# Patient Record
Sex: Female | Born: 1965 | Race: White | Hispanic: No | Marital: Married | Smoking: Never smoker
Health system: Southern US, Community
[De-identification: ages and names within clinical notes are randomized; demographics above are authoritative.]

## PROBLEM LIST (undated history)

## (undated) DIAGNOSIS — E079 Disorder of thyroid, unspecified: Secondary | ICD-10-CM

## (undated) HISTORY — PX: OTHER SURGICAL HISTORY: SHX169

## (undated) HISTORY — DX: Disorder of thyroid, unspecified: E07.9

## (undated) HISTORY — PX: WISDOM TOOTH EXTRACTION: SHX21

---

## 1998-02-18 ENCOUNTER — Other Ambulatory Visit: Admission: RE | Admit: 1998-02-18 | Discharge: 1998-02-18 | Payer: Self-pay | Admitting: *Deleted

## 1999-03-24 ENCOUNTER — Other Ambulatory Visit: Admission: RE | Admit: 1999-03-24 | Discharge: 1999-03-24 | Payer: Self-pay | Admitting: *Deleted

## 2006-08-20 ENCOUNTER — Other Ambulatory Visit: Admission: RE | Admit: 2006-08-20 | Discharge: 2006-08-20 | Payer: Self-pay | Admitting: Family Medicine

## 2006-11-03 ENCOUNTER — Encounter: Admission: RE | Admit: 2006-11-03 | Discharge: 2006-11-03 | Payer: Self-pay | Admitting: Family Medicine

## 2006-11-15 ENCOUNTER — Encounter: Admission: RE | Admit: 2006-11-15 | Discharge: 2006-11-15 | Payer: Self-pay | Admitting: Family Medicine

## 2007-05-10 ENCOUNTER — Encounter: Admission: RE | Admit: 2007-05-10 | Discharge: 2007-05-10 | Payer: Self-pay | Admitting: Family Medicine

## 2007-12-07 ENCOUNTER — Encounter: Admission: RE | Admit: 2007-12-07 | Discharge: 2007-12-07 | Payer: Self-pay | Admitting: Family Medicine

## 2008-04-09 ENCOUNTER — Other Ambulatory Visit: Admission: RE | Admit: 2008-04-09 | Discharge: 2008-04-09 | Payer: Self-pay | Admitting: Family Medicine

## 2008-10-10 ENCOUNTER — Other Ambulatory Visit: Admission: RE | Admit: 2008-10-10 | Discharge: 2008-10-10 | Payer: Self-pay | Admitting: Family Medicine

## 2008-12-07 ENCOUNTER — Encounter: Admission: RE | Admit: 2008-12-07 | Discharge: 2008-12-07 | Payer: Self-pay | Admitting: Family Medicine

## 2010-02-17 ENCOUNTER — Encounter: Admission: RE | Admit: 2010-02-17 | Discharge: 2010-02-17 | Payer: Self-pay | Admitting: Family Medicine

## 2010-11-09 ENCOUNTER — Encounter: Payer: Self-pay | Admitting: Family Medicine

## 2011-03-06 ENCOUNTER — Other Ambulatory Visit: Payer: Self-pay | Admitting: Family Medicine

## 2011-03-06 DIAGNOSIS — Z1231 Encounter for screening mammogram for malignant neoplasm of breast: Secondary | ICD-10-CM

## 2011-03-20 ENCOUNTER — Ambulatory Visit
Admission: RE | Admit: 2011-03-20 | Discharge: 2011-03-20 | Disposition: A | Discharge status: Home / Self Care | Payer: BC Managed Care – PPO | Source: Ambulatory Visit | Attending: Family Medicine | Admitting: Family Medicine

## 2011-03-20 DIAGNOSIS — Z1231 Encounter for screening mammogram for malignant neoplasm of breast: Secondary | ICD-10-CM

## 2012-02-22 ENCOUNTER — Other Ambulatory Visit: Payer: Self-pay | Admitting: Family Medicine

## 2012-02-22 DIAGNOSIS — Z1231 Encounter for screening mammogram for malignant neoplasm of breast: Secondary | ICD-10-CM

## 2012-03-21 ENCOUNTER — Ambulatory Visit
Admission: RE | Admit: 2012-03-21 | Discharge: 2012-03-21 | Disposition: A | Discharge status: Home / Self Care | Payer: BC Managed Care – PPO | Source: Ambulatory Visit | Attending: Family Medicine | Admitting: Family Medicine

## 2012-03-21 DIAGNOSIS — Z1231 Encounter for screening mammogram for malignant neoplasm of breast: Secondary | ICD-10-CM

## 2013-03-09 ENCOUNTER — Other Ambulatory Visit: Payer: Self-pay

## 2013-03-09 DIAGNOSIS — Z1231 Encounter for screening mammogram for malignant neoplasm of breast: Secondary | ICD-10-CM

## 2013-03-23 ENCOUNTER — Ambulatory Visit: Payer: BC Managed Care – PPO

## 2013-04-25 ENCOUNTER — Ambulatory Visit
Admission: RE | Admit: 2013-04-25 | Discharge: 2013-04-25 | Disposition: A | Discharge status: Home / Self Care | Payer: BC Managed Care – PPO | Source: Ambulatory Visit

## 2013-04-25 DIAGNOSIS — Z1231 Encounter for screening mammogram for malignant neoplasm of breast: Secondary | ICD-10-CM

## 2014-04-18 LAB — HM PAP SMEAR: HM PAP: NORMAL

## 2014-05-08 ENCOUNTER — Other Ambulatory Visit: Payer: Self-pay

## 2014-05-08 DIAGNOSIS — Z1231 Encounter for screening mammogram for malignant neoplasm of breast: Secondary | ICD-10-CM

## 2014-06-14 ENCOUNTER — Ambulatory Visit
Admission: RE | Admit: 2014-06-14 | Discharge: 2014-06-14 | Disposition: A | Discharge status: Home / Self Care | Payer: BC Managed Care – PPO | Source: Ambulatory Visit

## 2014-06-14 DIAGNOSIS — Z1231 Encounter for screening mammogram for malignant neoplasm of breast: Secondary | ICD-10-CM

## 2015-04-05 ENCOUNTER — Telehealth: Payer: Self-pay | Admitting: Family Medicine

## 2015-04-05 NOTE — Telephone Encounter (Signed)
Scheduled new pt appt based on tel note in pt husband acct.  From  Sheliah Hatch, MD   To  Madolyn Frieze   Sent and Delivered  03/29/2015 12:45 PM      Last Read in MyChart  03/29/2015 2:40 PM by Madolyn Frieze     We'd be happy to have her! I have no idea when my next available appt is- but you can certainly call the front desk and get her scheduled.   Have a wonderful weekend!  KT, MD      Previous Messages     ----- Message -----   From: Duplechain,RICHARD   Sent: 03/29/2015 7:18 AM EDT    To: Neena Rhymes, MD  Subject: Non-Urgent Medical Question   I wanted to check and see if Dr. Beverely Low is accepting new patients. My wife is unhappy with the level of preventative care from her current practice and is particularly concerned after the death of her father. Dr. Beverely Low and staff have always provided the utmost care and concern to me and I would like to extend that care to my wife.   If possible, when would be the earliest physical appointment available?   Thanks so much.  Richard

## 2015-05-09 ENCOUNTER — Other Ambulatory Visit: Payer: Self-pay

## 2015-05-09 DIAGNOSIS — Z1231 Encounter for screening mammogram for malignant neoplasm of breast: Secondary | ICD-10-CM

## 2015-06-19 LAB — HM MAMMOGRAPHY

## 2015-06-26 ENCOUNTER — Ambulatory Visit
Admission: RE | Admit: 2015-06-26 | Discharge: 2015-06-26 | Disposition: A | Discharge status: Home / Self Care | Payer: BC Managed Care – PPO | Source: Ambulatory Visit

## 2015-06-26 DIAGNOSIS — Z1231 Encounter for screening mammogram for malignant neoplasm of breast: Secondary | ICD-10-CM

## 2015-08-23 ENCOUNTER — Telehealth: Payer: Self-pay

## 2015-08-26 ENCOUNTER — Ambulatory Visit (INDEPENDENT_AMBULATORY_CARE_PROVIDER_SITE_OTHER): Payer: BC Managed Care – PPO | Admitting: Family Medicine

## 2015-08-26 ENCOUNTER — Encounter (INDEPENDENT_AMBULATORY_CARE_PROVIDER_SITE_OTHER): Payer: Self-pay

## 2015-08-26 ENCOUNTER — Encounter: Payer: Self-pay | Admitting: Family Medicine

## 2015-08-26 VITALS — BP 118/82 | HR 68 | Temp 98.2°F | Resp 16 | Ht 67.0 in | Wt 185.5 lb

## 2015-08-26 DIAGNOSIS — G4700 Insomnia, unspecified: Secondary | ICD-10-CM | POA: Diagnosis not present

## 2015-08-26 DIAGNOSIS — E669 Obesity, unspecified: Secondary | ICD-10-CM | POA: Insufficient documentation

## 2015-08-26 DIAGNOSIS — E663 Overweight: Secondary | ICD-10-CM | POA: Diagnosis not present

## 2015-08-26 DIAGNOSIS — Z8379 Family history of other diseases of the digestive system: Secondary | ICD-10-CM | POA: Diagnosis not present

## 2015-08-26 DIAGNOSIS — N951 Menopausal and female climacteric states: Secondary | ICD-10-CM | POA: Insufficient documentation

## 2015-08-26 MED ORDER — VENLAFAXINE HCL ER 37.5 MG PO CP24: ORAL_CAPSULE | ORAL | Status: DC

## 2015-08-26 MED ORDER — TRAZODONE HCL 50 MG PO TABS: 25.0000 mg | ORAL_TABLET | Freq: Every evening | ORAL | Status: DC | PRN

## 2015-08-26 NOTE — Patient Instructions (Signed)
Follow up in 4-6 weeks to recheck hot flashes, mood, weight loss progress We'll notify you of your lab results and make any changes if needed Continue to work on a low carb diet and get regular exercise Start the Effexor in the AM to improve your menopausal symptoms Start the Trazodone as needed for sleep.  Start w/ 1/2 tab nightly Drink LOTS of water! Call with any questions or concerns Welcome!  We're glad to have you!!!

## 2015-08-26 NOTE — Progress Notes (Signed)
   Subjective:    Patient ID: Dana Zavala, female    DOB: 10-27-1965, 49 y.o.   MRN: [id number]  HPI New to establish.  Previous MD- Bj[facility], [facility].  Health Maintenance- UTD on pap (done July 2015), mammo [facility]  Family hx of liver disease- pt reports father had life long hx of elevated liver enzymes.  Got sick very quickly earlier this year and was determined to have liver cancer.  Pt is very concerned as to whether this is genetic  Menopausal- pt has not had a period in over a year.  + weight gain.  Pt has gained 15 lbs since last August w/o dietary or activity changes.  + hot flashes, insomnia.  Some relief w/ Black Cohosh.   Review of Systems For ROS see HPI     Objective:   Physical Exam  Constitutional: She is oriented to person, place, and time. She appears well-developed and well-nourished. No distress.  overweight  HENT:  Head: Normocephalic and atraumatic.  Eyes: Conjunctivae and EOM are normal. Pupils are equal, round, and reactive to light.  Neck: Normal range of motion. Neck supple. No thyromegaly present.  Cardiovascular: Normal rate, regular rhythm, normal heart sounds and intact distal pulses.   No murmur heard. Pulmonary/Chest: Effort normal and breath sounds normal. No respiratory distress.  Abdominal: Soft. She exhibits no distension. There is no tenderness.  Musculoskeletal: She exhibits no edema.  Lymphadenopathy:    She has no cervical adenopathy.  Neurological: She is alert and oriented to person, place, and time.  Skin: Skin is warm and dry.  Psychiatric: She has a normal mood and affect. Her behavior is normal.  Vitals reviewed.         Assessment & Plan:

## 2015-08-26 NOTE — Progress Notes (Signed)
Pre visit review using our clinic review tool, if applicable. No additional management support is needed unless otherwise documented below in the visit note. 

## 2015-08-27 ENCOUNTER — Encounter: Payer: Self-pay | Admitting: General Practice

## 2015-08-27 LAB — CBC WITH DIFFERENTIAL/PLATELET
BASOS ABS: 0 10*3/uL (ref 0.0–0.1)
BASOS PCT: 0.6 % (ref 0.0–3.0)
EOS ABS: 0.1 10*3/uL (ref 0.0–0.7)
Eosinophils Relative: 1.1 % (ref 0.0–5.0)
HEMATOCRIT: 40.9 % (ref 36.0–46.0)
HEMOGLOBIN: 13.8 g/dL (ref 12.0–15.0)
LYMPHS PCT: 26.9 % (ref 12.0–46.0)
Lymphs Abs: 2 10*3/uL (ref 0.7–4.0)
MCHC: 33.8 g/dL (ref 30.0–36.0)
MCV: 89.6 fl (ref 78.0–100.0)
MONO ABS: 0.5 10*3/uL (ref 0.1–1.0)
Monocytes Relative: 6.5 % (ref 3.0–12.0)
Neutro Abs: 4.9 10*3/uL (ref 1.4–7.7)
Neutrophils Relative %: 64.9 % (ref 43.0–77.0)
Platelets: 277 10*3/uL (ref 150.0–400.0)
RBC: 4.57 Mil/uL (ref 3.87–5.11)
RDW: 12.4 % (ref 11.5–15.5)
WBC: 7.6 10*3/uL (ref 4.0–10.5)

## 2015-08-27 LAB — HEPATIC FUNCTION PANEL
ALBUMIN: 4.5 g/dL (ref 3.5–5.2)
ALT: 23 U/L (ref 0–35)
AST: 24 U/L (ref 0–37)
Alkaline Phosphatase: 61 U/L (ref 39–117)
BILIRUBIN TOTAL: 1 mg/dL (ref 0.2–1.2)
Bilirubin, Direct: 0.2 mg/dL (ref 0.0–0.3)
Total Protein: 7.3 g/dL (ref 6.0–8.3)

## 2015-08-27 LAB — BASIC METABOLIC PANEL
BUN: 17 mg/dL (ref 6–23)
CALCIUM: 10.3 mg/dL (ref 8.4–10.5)
CHLORIDE: 104 meq/L (ref 96–112)
CO2: 29 meq/L (ref 19–32)
CREATININE: 0.86 mg/dL (ref 0.40–1.20)
GFR: 74.42 mL/min (ref >60.00)
Glucose, Bld: 77 mg/dL (ref 70–99)
Potassium: 3.7 mEq/L (ref 3.5–5.1)
Sodium: 142 mEq/L (ref 135–145)

## 2015-08-27 LAB — LIPID PANEL
CHOL/HDL RATIO: 4
Cholesterol: 185 mg/dL (ref 0–200)
HDL: 49.8 mg/dL (ref >39.00)
LDL Cholesterol: 102 mg/dL — ABNORMAL HIGH (ref 0–99)
NONHDL: 135.68
TRIGLYCERIDES: 166 mg/dL — AB (ref 0.0–149.0)
VLDL: 33.2 mg/dL (ref 0.0–40.0)

## 2015-08-27 LAB — TSH: TSH: 2.55 u[IU]/mL (ref 0.35–4.50)

## 2015-08-27 NOTE — Telephone Encounter (Signed)
Pre-Visit call compleded

## 2015-08-28 ENCOUNTER — Telehealth: Payer: Self-pay | Admitting: *Deleted

## 2015-08-28 NOTE — Telephone Encounter (Signed)
Forwarded to Dr. Gayla Dossabori/Martavion Couper. JG//CMA

## 2015-09-18 NOTE — Assessment & Plan Note (Signed)
New.  Pt is struggling w/ weight gain, insomnia, hot flashes, irritability.  Based on her constellation of sxs, will start Effexor daily and monitor for improvement.  Pt expressed understanding and is in agreement w/ plan.

## 2015-09-18 NOTE — Assessment & Plan Note (Signed)
New to provider, ongoing for pt.  Check labs to risk stratify.  Stressed need for healthy diet and regular exercise.  Will follow. 

## 2015-09-18 NOTE — Assessment & Plan Note (Signed)
New.  Maybe a component of her menopausal syndrome but in order to provider relief, will start Trazodone.  Pt expressed understanding and is in agreement w/ plan.

## 2015-09-18 NOTE — Assessment & Plan Note (Signed)
New to provider.  Check labs as baseline.  If elevated, will need US.  Stressed need for healthy diet and weight management to prevent fatty liver.  Will follow.

## 2015-10-02 ENCOUNTER — Other Ambulatory Visit: Payer: Self-pay | Admitting: General Practice

## 2015-10-02 MED ORDER — VENLAFAXINE HCL ER 37.5 MG PO CP24: ORAL_CAPSULE | ORAL | Status: DC

## 2015-10-07 ENCOUNTER — Encounter: Payer: Self-pay | Admitting: Family Medicine

## 2015-10-07 ENCOUNTER — Ambulatory Visit (INDEPENDENT_AMBULATORY_CARE_PROVIDER_SITE_OTHER): Payer: BC Managed Care – PPO | Admitting: Family Medicine

## 2015-10-07 VITALS — BP 110/90 | HR 77 | Temp 98.1°F | Resp 16 | Ht 67.0 in | Wt 183.4 lb

## 2015-10-07 DIAGNOSIS — E663 Overweight: Secondary | ICD-10-CM

## 2015-10-07 DIAGNOSIS — N951 Menopausal and female climacteric states: Secondary | ICD-10-CM

## 2015-10-07 MED ORDER — VENLAFAXINE HCL ER 75 MG PO CP24: 75.0000 mg | ORAL_CAPSULE | Freq: Every day | ORAL | Status: DC

## 2015-10-07 NOTE — Progress Notes (Signed)
   Subjective:    Patient ID: Dana Zavala, female    DOB: 10-May-1966, 49 y.o.   MRN: [id number]  HPI Menopausal sxs- pt reports hot flashes have dramatically improved since starting Effexor.  Will still have some breakthrough sxs but not nearly as severe as previous.  Sleep has improved b/c of this.  Pt feels emotional sxs are also improving.  Pt has increased hope compared to last visit.    Obesity- pt has dramatically cut back on carbs.  Has lost 3 lbs over the holiday season.   Review of Systems For ROS see HPI     Objective:   Physical Exam  Constitutional: She is oriented to person, place, and time. She appears well-developed and well-nourished. No distress.  HENT:  Head: Normocephalic and atraumatic.  Eyes: Conjunctivae and EOM are normal. Pupils are equal, round, and reactive to light.  Neurological: She is alert and oriented to person, place, and time.  Skin: Skin is warm and dry.  Psychiatric: She has a normal mood and affect. Her behavior is normal. Thought content normal.  Vitals reviewed.         Assessment & Plan:

## 2015-10-07 NOTE — Progress Notes (Signed)
Pre visit review using our clinic review tool, if applicable. No additional management support is needed unless otherwise documented below in the visit note. 

## 2015-10-07 NOTE — Patient Instructions (Signed)
Schedule your complete physical in 6 months We'll notify you of your lab results and make any changes if needed Continue the Effexor daily at 75mg  daily Keep up the good work on healthy diet and regular exercise- you are doing it!! Call with any questions or concerns If you want to join us at the new [place] office, any scheduled appointments will automatically transfer and we will see you at [address], [place], [place] [id number] (OPENING 10/22/15) Happy Holidays!!!

## 2015-10-07 NOTE — Assessment & Plan Note (Signed)
Improved since starting Effexor.  Hot flashes have dramatically decreased and mood has improved.  Continue Effexor at 75mg  daily.  New script provided.  Will follow.

## 2015-10-07 NOTE — Assessment & Plan Note (Signed)
Pt is working on changing her diet.  Is now eating in moderation and making trade-offs if there's something she wants.  Discussed need for regular exercise.  Will continue to follow.

## 2015-10-08 ENCOUNTER — Other Ambulatory Visit: Payer: Self-pay | Admitting: General Practice

## 2015-10-08 NOTE — Telephone Encounter (Signed)
Pt was seen yesterday.  Trazodone last filled 08/26/15 #30 with 3, pt is requesting a #90 day supply. Ok to fill?

## 2015-10-09 MED ORDER — TRAZODONE HCL 50 MG PO TABS: 25.0000 mg | ORAL_TABLET | Freq: Every evening | ORAL | Status: DC | PRN

## 2015-10-09 NOTE — Telephone Encounter (Signed)
Ok for #90, 1 refill 

## 2015-10-09 NOTE — Telephone Encounter (Signed)
Medication filled to pharmacy as requested.   

## 2015-11-28 ENCOUNTER — Other Ambulatory Visit: Payer: Self-pay

## 2015-11-28 MED ORDER — VENLAFAXINE HCL ER 75 MG PO CP24: 75.0000 mg | ORAL_CAPSULE | Freq: Every day | ORAL | Status: DC

## 2015-12-02 ENCOUNTER — Other Ambulatory Visit: Payer: Self-pay

## 2015-12-02 ENCOUNTER — Telehealth: Payer: Self-pay | Admitting: Family Medicine

## 2015-12-02 MED ORDER — VENLAFAXINE HCL ER 75 MG PO CP24: 75.0000 mg | ORAL_CAPSULE | Freq: Every day | ORAL | Status: DC

## 2015-12-02 NOTE — Telephone Encounter (Signed)
Reordered medication. Sent to [facility] per patient request.

## 2015-12-02 NOTE — Telephone Encounter (Signed)
Relation to ZO:XWRU Call back number:[phone] Pharmacy: [facility]   Reason for call:  Patient requesting venlafaxine XR (EFFEXOR-XR) 75 MG 24 hr capsule to go to [facility] instead of retail, patient states she has only 1 left and will check with pharmacy to see if they a can give her a few pills to hold her over until mail order is received

## 2015-12-16 ENCOUNTER — Other Ambulatory Visit: Payer: Self-pay | Admitting: General Practice

## 2015-12-16 MED ORDER — VENLAFAXINE HCL ER 75 MG PO CP24: 75.0000 mg | ORAL_CAPSULE | Freq: Every day | ORAL | Status: DC

## 2016-04-06 ENCOUNTER — Other Ambulatory Visit (HOSPITAL_COMMUNITY)
Admission: RE | Admit: 2016-04-06 | Discharge: 2016-04-06 | Disposition: A | Discharge status: Home / Self Care | Payer: BC Managed Care – PPO | Source: Ambulatory Visit | Attending: Family Medicine | Admitting: Family Medicine

## 2016-04-06 ENCOUNTER — Ambulatory Visit (INDEPENDENT_AMBULATORY_CARE_PROVIDER_SITE_OTHER): Payer: BC Managed Care – PPO | Admitting: Family Medicine

## 2016-04-06 ENCOUNTER — Encounter: Payer: Self-pay | Admitting: Family Medicine

## 2016-04-06 VITALS — BP 111/70 | HR 84 | Temp 98.5°F | Resp 16 | Ht 67.0 in | Wt 188.1 lb

## 2016-04-06 DIAGNOSIS — Z1151 Encounter for screening for human papillomavirus (HPV): Secondary | ICD-10-CM | POA: Diagnosis not present

## 2016-04-06 DIAGNOSIS — Z01419 Encounter for gynecological examination (general) (routine) without abnormal findings: Secondary | ICD-10-CM | POA: Diagnosis present

## 2016-04-06 DIAGNOSIS — Z23 Encounter for immunization: Secondary | ICD-10-CM

## 2016-04-06 DIAGNOSIS — Z0000 Encounter for general adult medical examination without abnormal findings: Secondary | ICD-10-CM | POA: Diagnosis not present

## 2016-04-06 DIAGNOSIS — Z1211 Encounter for screening for malignant neoplasm of colon: Secondary | ICD-10-CM

## 2016-04-06 DIAGNOSIS — Z124 Encounter for screening for malignant neoplasm of cervix: Secondary | ICD-10-CM

## 2016-04-06 LAB — LIPID PANEL
CHOLESTEROL: 188 mg/dL (ref 0–200)
HDL: 52.8 mg/dL (ref >39.00)
LDL Cholesterol: 116 mg/dL — ABNORMAL HIGH (ref 0–99)
NonHDL: 134.75
TRIGLYCERIDES: 93 mg/dL (ref 0.0–149.0)
Total CHOL/HDL Ratio: 4
VLDL: 18.6 mg/dL (ref 0.0–40.0)

## 2016-04-06 LAB — HEPATIC FUNCTION PANEL
ALBUMIN: 4.5 g/dL (ref 3.5–5.2)
ALK PHOS: 66 U/L (ref 39–117)
ALT: 25 U/L (ref 0–35)
AST: 24 U/L (ref 0–37)
Bilirubin, Direct: 0.2 mg/dL (ref 0.0–0.3)
Total Bilirubin: 1.2 mg/dL (ref 0.2–1.2)
Total Protein: 7.2 g/dL (ref 6.0–8.3)

## 2016-04-06 LAB — BASIC METABOLIC PANEL
BUN: 17 mg/dL (ref 6–23)
CHLORIDE: 106 meq/L (ref 96–112)
CO2: 25 meq/L (ref 19–32)
CREATININE: 0.77 mg/dL (ref 0.40–1.20)
Calcium: 9.5 mg/dL (ref 8.4–10.5)
GFR: 84.33 mL/min (ref >60.00)
Glucose, Bld: 84 mg/dL (ref 70–99)
POTASSIUM: 3.9 meq/L (ref 3.5–5.1)
Sodium: 140 mEq/L (ref 135–145)

## 2016-04-06 LAB — CBC WITH DIFFERENTIAL/PLATELET
BASOS PCT: 0.4 % (ref 0.0–3.0)
Basophils Absolute: 0 10*3/uL (ref 0.0–0.1)
EOS ABS: 0.1 10*3/uL (ref 0.0–0.7)
EOS PCT: 1.5 % (ref 0.0–5.0)
HCT: 39.8 % (ref 36.0–46.0)
HEMOGLOBIN: 13.6 g/dL (ref 12.0–15.0)
LYMPHS ABS: 1.4 10*3/uL (ref 0.7–4.0)
Lymphocytes Relative: 25.2 % (ref 12.0–46.0)
MCHC: 34.2 g/dL (ref 30.0–36.0)
MCV: 88.8 fl (ref 78.0–100.0)
MONO ABS: 0.4 10*3/uL (ref 0.1–1.0)
Monocytes Relative: 7.2 % (ref 3.0–12.0)
NEUTROS PCT: 65.7 % (ref 43.0–77.0)
Neutro Abs: 3.8 10*3/uL (ref 1.4–7.7)
Platelets: 272 10*3/uL (ref 150.0–400.0)
RBC: 4.49 Mil/uL (ref 3.87–5.11)
RDW: 12.7 % (ref 11.5–15.5)
WBC: 5.7 10*3/uL (ref 4.0–10.5)

## 2016-04-06 LAB — TSH: TSH: 3.41 u[IU]/mL (ref 0.35–4.50)

## 2016-04-06 MED ORDER — VENLAFAXINE HCL ER 150 MG PO CP24: 150.0000 mg | ORAL_CAPSULE | Freq: Every day | ORAL | Status: DC

## 2016-04-06 NOTE — Assessment & Plan Note (Signed)
Pt's PE WNL w/ exception of obesity.  UTD on mammo.  Pap done today due to ASCUS 2015.  Due for colonoscopy now that she is 50- referral placed.  Check labs.  Anticipatory guidance provided.

## 2016-04-06 NOTE — Progress Notes (Signed)
Pre visit review using our clinic review tool, if applicable. No additional management support is needed unless otherwise documented below in the visit note. 

## 2016-04-06 NOTE — Assessment & Plan Note (Signed)
Pap collected. 

## 2016-04-06 NOTE — Progress Notes (Signed)
   Subjective:    Patient ID: Dana Zavala, female    DOB: 04/10/1966, 50 y.o.   MRN: [id number]  HPI CPE- UTD on mammo.  Last pap was 2015- ASCUS.  Due for colonoscopy.   Review of Systems Patient reports no vision/ hearing changes, adenopathy,fever, weight change,  persistant/recurrent hoarseness , swallowing issues, chest pain, palpitations, edema, persistant/recurrent cough, hemoptysis, dyspnea (rest/exertional/paroxysmal nocturnal), gastrointestinal bleeding (melena, rectal bleeding), abdominal pain, significant heartburn, bowel changes, GU symptoms (dysuria, hematuria, incontinence), Gyn symptoms (abnormal  bleeding, pain),  syncope, focal weakness, memory loss, numbness & tingling, skin/hair/nail changes, abnormal bruising or bleeding, anxiety, or depression.     Objective:   Physical Exam  General Appearance:    Alert, cooperative, no distress, appears stated age  Head:    Normocephalic, without obvious abnormality, atraumatic  Eyes:    PERRL, conjunctiva/corneas clear, EOM's intact, fundi    benign, both eyes  Ears:    Normal TM's and external ear canals, both ears  Nose:   Nares normal, septum midline, mucosa normal, no drainage    or sinus tenderness  Throat:   Lips, mucosa, and tongue normal; teeth and gums normal  Neck:   Supple, symmetrical, trachea midline, no adenopathy;    Thyroid: no enlargement/tenderness/nodules  Back:     Symmetric, no curvature, ROM normal, no CVA tenderness  Lungs:     Clear to auscultation bilaterally, respirations unlabored  Chest Wall:    No tenderness or deformity   Heart:    Regular rate and rhythm, S1 and S2 normal, no murmur, rub   or gallop  Breast Exam:    Deferred to mammo  Abdomen:     Soft, non-tender, bowel sounds active all four quadrants,    no masses, no organomegaly  Genitalia:    External genitalia normal, cervix normal in appearance, no CMT, uterus in normal size and position, adnexa w/out mass or tenderness, mucosa pink and  moist, no lesions or discharge present  Rectal:    Normal external appearance  Extremities:   Extremities normal, atraumatic, no cyanosis or edema  Pulses:   2+ and symmetric all extremities  Skin:   Skin color, texture, turgor normal, no rashes or lesions  Lymph nodes:   Cervical, supraclavicular, and axillary nodes normal  Neurologic:   CNII-XII intact, normal strength, sensation and reflexes    throughout          Assessment & Plan:

## 2016-04-06 NOTE — Patient Instructions (Signed)
Follow up in 1 year or as needed We'll notify you of your lab results and make any changes if needed Continue to work on healthy diet and regular exercise- you can do it!! We'll call you with your GI appt for the colonoscopy consultation Call with any questions or concerns Have a great summer!!!

## 2016-04-07 LAB — CYTOLOGY - PAP

## 2016-04-09 ENCOUNTER — Encounter: Payer: Self-pay | Admitting: Gastroenterology

## 2016-05-07 ENCOUNTER — Encounter: Payer: Self-pay | Admitting: Gastroenterology

## 2016-05-07 ENCOUNTER — Ambulatory Visit (AMBULATORY_SURGERY_CENTER): Payer: Self-pay

## 2016-05-07 VITALS — Ht 68.0 in | Wt 185.8 lb

## 2016-05-07 DIAGNOSIS — Z1211 Encounter for screening for malignant neoplasm of colon: Secondary | ICD-10-CM

## 2016-05-07 MED ORDER — SUPREP BOWEL PREP KIT 17.5-3.13-1.6 GM/177ML PO SOLN: 1.0000 | Freq: Once | ORAL | Status: DC

## 2016-05-07 NOTE — Progress Notes (Signed)
No diet meds No home oxygen No past problems with anesthesia No allergies to eggs or soy  Has email and internet; registered for emmi

## 2016-05-21 ENCOUNTER — Encounter: Payer: Self-pay | Admitting: Gastroenterology

## 2016-05-21 ENCOUNTER — Ambulatory Visit (AMBULATORY_SURGERY_CENTER): Payer: BC Managed Care – PPO | Admitting: Gastroenterology

## 2016-05-21 VITALS — BP 124/82 | HR 60 | Temp 97.3°F | Resp 18 | Ht 68.0 in | Wt 185.0 lb

## 2016-05-21 DIAGNOSIS — Z1211 Encounter for screening for malignant neoplasm of colon: Secondary | ICD-10-CM

## 2016-05-21 MED ORDER — SODIUM CHLORIDE 0.9 % IV SOLN: 500.0000 mL | INTRAVENOUS | Status: DC

## 2016-05-21 NOTE — Op Note (Signed)
[facility] Patient Name: Dana Zavala Procedure Date: 05/21/2016 8:52 AM MRN: [id number] Endoscopist: Sherilyn Cooter L. Myrtie Neither , MD Age: 50 Referring MD:  Date of Birth: 01/15/1966 Gender: Female Account #: [id number] Procedure:                Colonoscopy Indications:              Screening for colorectal malignant neoplasm, This                            is the patient's first colonoscopy Medicines:                Monitored Anesthesia Care Procedure:                Pre-Anesthesia Assessment:                           - Prior to the procedure, a History and Physical                            was performed, and patient medications and                            allergies were reviewed. The patient's tolerance of                            previous anesthesia was also reviewed. The risks                            and benefits of the procedure and the sedation                            options and risks were discussed with the patient.                            All questions were answered, and informed consent                            was obtained. Prior Anticoagulants: The patient has                            taken no previous anticoagulant or antiplatelet                            agents. ASA Grade Assessment: II - A patient with                            mild systemic disease. After reviewing the risks                            and benefits, the patient was deemed in                            satisfactory condition to undergo the procedure.  After obtaining informed consent, the colonoscope                            was passed under direct vision. Throughout the                            procedure, the patient's blood pressure, pulse, and                            oxygen saturations were monitored continuously. The                            Model CF-HQ190L [id number]) scope was introduced                            through the anus and  advanced to the the cecum,                            identified by appendiceal orifice and ileocecal                            valve. The colonoscopy was performed without                            difficulty. The patient tolerated the procedure                            well. The quality of the bowel preparation was                            good. The ileocecal valve, appendiceal orifice, and                            rectum were photographed. The bowel preparation                            used was SUPREP. Scope In: 9:00:30 AM Scope Out: 9:16:26 AM Scope Withdrawal Time: 0 hours 11 minutes 44 seconds  Total Procedure Duration: 0 hours 15 minutes 56 seconds  Findings:                 The perianal and digital rectal examinations were                            normal.                           The entire examined colon appeared normal on direct                            and retroflexion views. Complications:            No immediate complications. Estimated Blood Loss:     Estimated blood loss: none. Impression:               - The entire examined colon is normal  on direct and                            retroflexion views.                           - No specimens collected. Recommendation:           - Patient has a contact number available for                            emergencies. The signs and symptoms of potential                            delayed complications were discussed with the                            patient. Return to normal activities tomorrow.                            Written discharge instructions were provided to the                            patient.                           - Resume previous diet.                           - Continue present medications.                           - Repeat colonoscopy in 10 years for screening                            purposes. Nyjai Graff L. Myrtie Neither, MD 05/21/2016 9:18:51 AM This report has been signed electronically.

## 2016-05-21 NOTE — Progress Notes (Signed)
Report to PACU, RN, vss, BBS= Clear.  

## 2016-05-21 NOTE — Patient Instructions (Signed)
YOU HAD AN ENDOSCOPIC PROCEDURE TODAY AT THE [facility]:   Refer to the procedure report that was given to you for any specific questions about what was found during the examination.  If the procedure report does not answer your questions, please call your gastroenterologist to clarify.  If you requested that your care partner not be given the details of your procedure findings, then the procedure report has been included in a sealed envelope for you to review at your convenience later.  YOU SHOULD EXPECT: Some feelings of bloating in the abdomen. Passage of more gas than usual.  Walking can help get rid of the air that was put into your GI tract during the procedure and reduce the bloating. If you had a lower endoscopy (such as a colonoscopy or flexible sigmoidoscopy) you may notice spotting of blood in your stool or on the toilet paper. If you underwent a bowel prep for your procedure, you may not have a normal bowel movement for a few days.  Please Note:  You might notice some irritation and congestion in your nose or some drainage.  This is from the oxygen used during your procedure.  There is no need for concern and it should clear up in a day or so.  SYMPTOMS TO REPORT IMMEDIATELY:   Following lower endoscopy (colonoscopy or flexible sigmoidoscopy):  Excessive amounts of blood in the stool  Significant tenderness or worsening of abdominal pains  Swelling of the abdomen that is new, acute  Fever of 100F or higher  For urgent or emergent issues, a gastroenterologist can be reached at any hour by calling [phone].   DIET: Your first meal following the procedure should be a small meal and then it is ok to progress to your normal diet. Heavy or fried foods are harder to digest and may make you feel nauseous or bloated.  Likewise, meals heavy in dairy and vegetables can increase bloating.  Drink plenty of fluids but you should avoid alcoholic beverages for 24  hours.  ACTIVITY:  You should plan to take it easy for the rest of today and you should NOT DRIVE or use heavy machinery until tomorrow (because of the sedation medicines used during the test).    FOLLOW UP: Our staff will call the number listed on your records the next business day following your procedure to check on you and address any questions or concerns that you may have regarding the information given to you following your procedure. If we do not reach you, we will leave a message.  However, if you are feeling well and you are not experiencing any problems, there is no need to return our call.  We will assume that you have returned to your regular daily activities without incident.  If any biopsies were taken you will be contacted by phone or by letter within the next 1-3 weeks.  Please call us at [phone] if you have not heard about the biopsies in 3 weeks.    SIGNATURES/CONFIDENTIALITY: You and/or your care partner have signed paperwork which will be entered into your electronic medical record.  These signatures attest to the fact that that the information above on your After Visit Summary has been reviewed and is understood.  Full responsibility of the confidentiality of this discharge information lies with you and/or your care-partner.  Next colonoscopy in 10 years. 

## 2016-05-22 ENCOUNTER — Other Ambulatory Visit: Payer: Self-pay | Admitting: Family Medicine

## 2016-05-22 ENCOUNTER — Telehealth: Payer: Self-pay | Admitting: *Deleted

## 2016-05-22 DIAGNOSIS — Z1231 Encounter for screening mammogram for malignant neoplasm of breast: Secondary | ICD-10-CM

## 2016-05-22 NOTE — Telephone Encounter (Signed)
Left message on f/u call 

## 2016-06-08 ENCOUNTER — Encounter: Payer: Self-pay | Admitting: Family Medicine

## 2016-07-01 ENCOUNTER — Ambulatory Visit
Admission: RE | Admit: 2016-07-01 | Discharge: 2016-07-01 | Disposition: A | Discharge status: Home / Self Care | Payer: BC Managed Care – PPO | Source: Ambulatory Visit | Attending: Family Medicine | Admitting: Family Medicine

## 2016-07-01 DIAGNOSIS — Z1231 Encounter for screening mammogram for malignant neoplasm of breast: Secondary | ICD-10-CM

## 2016-07-07 ENCOUNTER — Encounter: Payer: Self-pay | Admitting: Family Medicine

## 2017-03-21 ENCOUNTER — Other Ambulatory Visit: Payer: Self-pay | Admitting: Family Medicine

## 2017-04-08 ENCOUNTER — Encounter: Payer: Self-pay | Admitting: Family Medicine

## 2017-04-08 ENCOUNTER — Encounter: Payer: Self-pay | Admitting: General Practice

## 2017-04-08 ENCOUNTER — Ambulatory Visit (INDEPENDENT_AMBULATORY_CARE_PROVIDER_SITE_OTHER): Payer: BC Managed Care – PPO | Admitting: Family Medicine

## 2017-04-08 VITALS — BP 120/84 | HR 76 | Temp 98.6°F | Resp 16 | Ht 68.0 in | Wt 193.1 lb

## 2017-04-08 DIAGNOSIS — Z0000 Encounter for general adult medical examination without abnormal findings: Secondary | ICD-10-CM | POA: Diagnosis not present

## 2017-04-08 LAB — CBC WITH DIFFERENTIAL/PLATELET
BASOS PCT: 0.4 % (ref 0.0–3.0)
Basophils Absolute: 0 10*3/uL (ref 0.0–0.1)
EOS ABS: 0.1 10*3/uL (ref 0.0–0.7)
EOS PCT: 1.3 % (ref 0.0–5.0)
HCT: 41.6 % (ref 36.0–46.0)
Hemoglobin: 14 g/dL (ref 12.0–15.0)
LYMPHS ABS: 1.5 10*3/uL (ref 0.7–4.0)
Lymphocytes Relative: 26.1 % (ref 12.0–46.0)
MCHC: 33.7 g/dL (ref 30.0–36.0)
MCV: 88.4 fl (ref 78.0–100.0)
MONO ABS: 0.4 10*3/uL (ref 0.1–1.0)
Monocytes Relative: 7.5 % (ref 3.0–12.0)
NEUTROS PCT: 64.7 % (ref 43.0–77.0)
Neutro Abs: 3.8 10*3/uL (ref 1.4–7.7)
Platelets: 297 10*3/uL (ref 150.0–400.0)
RBC: 4.7 Mil/uL (ref 3.87–5.11)
RDW: 12.8 % (ref 11.5–15.5)
WBC: 5.8 10*3/uL (ref 4.0–10.5)

## 2017-04-08 LAB — BASIC METABOLIC PANEL
BUN: 20 mg/dL (ref 6–23)
CALCIUM: 10.3 mg/dL (ref 8.4–10.5)
CHLORIDE: 104 meq/L (ref 96–112)
CO2: 30 meq/L (ref 19–32)
Creatinine, Ser: 0.9 mg/dL (ref 0.40–1.20)
GFR: 70.15 mL/min (ref >60.00)
Glucose, Bld: 100 mg/dL — ABNORMAL HIGH (ref 70–99)
Potassium: 4.3 mEq/L (ref 3.5–5.1)
SODIUM: 141 meq/L (ref 135–145)

## 2017-04-08 LAB — HEPATIC FUNCTION PANEL
ALK PHOS: 91 U/L (ref 39–117)
ALT: 25 U/L (ref 0–35)
AST: 23 U/L (ref 0–37)
Albumin: 4.6 g/dL (ref 3.5–5.2)
BILIRUBIN DIRECT: 0.1 mg/dL (ref 0.0–0.3)
BILIRUBIN TOTAL: 0.8 mg/dL (ref 0.2–1.2)
TOTAL PROTEIN: 6.8 g/dL (ref 6.0–8.3)

## 2017-04-08 LAB — LIPID PANEL
CHOL/HDL RATIO: 5
Cholesterol: 222 mg/dL — ABNORMAL HIGH (ref 0–200)
HDL: 47 mg/dL (ref >39.00)
LDL Cholesterol: 139 mg/dL — ABNORMAL HIGH (ref 0–99)
NONHDL: 174.72
Triglycerides: 177 mg/dL — ABNORMAL HIGH (ref 0.0–149.0)
VLDL: 35.4 mg/dL (ref 0.0–40.0)

## 2017-04-08 MED ORDER — TRIAMCINOLONE ACETONIDE 0.1 % EX CREA: 1.0000 "application " | TOPICAL_CREAM | Freq: Two times a day (BID) | CUTANEOUS | 1 refills | Status: DC

## 2017-04-08 MED ORDER — VENLAFAXINE HCL ER 150 MG PO CP24
ORAL_CAPSULE | ORAL | 1 refills | Status: DC
Start: 2017-04-08 — End: 2017-12-09

## 2017-04-08 NOTE — Progress Notes (Signed)
   Subjective:    Patient ID: Dana Zavala, female    DOB: 1966/03/27, 51 y.o.   MRN: [id number]  HPI CPE- UTD on pap, mammo, colonoscopy.  Due for Tdap next year.   Review of Systems Patient reports no vision/ hearing changes, adenopathy,fever, weight change,  persistant/recurrent hoarseness , swallowing issues, chest pain, palpitations, edema, persistant/recurrent cough, hemoptysis, dyspnea (rest/exertional/paroxysmal nocturnal), gastrointestinal bleeding (melena, rectal bleeding), abdominal pain, significant heartburn, bowel changes, GU symptoms (dysuria, hematuria, incontinence), Gyn symptoms (abnormal  bleeding, pain),  syncope, focal weakness, memory loss, numbness & tingling, skin/hair/nail changes, abnormal bruising or bleeding, anxiety, or depression.     Objective:   Physical Exam General Appearance:    Alert, cooperative, no distress, appears stated age  Head:    Normocephalic, without obvious abnormality, atraumatic  Eyes:    PERRL, conjunctiva/corneas clear, EOM's intact, fundi    benign, both eyes  Ears:    Normal TM's and external ear canals, both ears  Nose:   Nares normal, septum midline, mucosa normal, no drainage    or sinus tenderness  Throat:   Lips, mucosa, and tongue normal; teeth and gums normal  Neck:   Supple, symmetrical, trachea midline, no adenopathy;    Thyroid: no enlargement/tenderness/nodules  Back:     Symmetric, no curvature, ROM normal, no CVA tenderness  Lungs:     Clear to auscultation bilaterally, respirations unlabored  Chest Wall:    No tenderness or deformity   Heart:    Regular rate and rhythm, S1 and S2 normal, no murmur, rub   or gallop  Breast Exam:    Deferred to mammo  Abdomen:     Soft, non-tender, bowel sounds active all four quadrants,    no masses, no organomegaly  Genitalia:    Deferred  Rectal:    Extremities:   Extremities normal, atraumatic, no cyanosis or edema  Pulses:   2+ and symmetric all extremities  Skin:   Skin color,  texture, turgor normal, no rashes or lesions  Lymph nodes:   Cervical, supraclavicular, and axillary nodes normal  Neurologic:   CNII-XII intact, normal strength, sensation and reflexes    throughout          Assessment & Plan:

## 2017-04-08 NOTE — Assessment & Plan Note (Signed)
Pt's PE WNL.  UTD on mammo, pap, colonoscopy.  Due for Tdap next year.  Check labs.  Anticipatory guidance provided.

## 2017-04-08 NOTE — Patient Instructions (Signed)
Follow up in 1 year or as needed We'll notify you of your lab results and make any changes if needed Continue to work on healthy diet and regular exercise- you can do it!!! Call with any questions or concerns Have a great summer!!  

## 2017-04-08 NOTE — Progress Notes (Signed)
Pre visit review using our clinic review tool, if applicable. No additional management support is needed unless otherwise documented below in the visit note. 

## 2017-06-10 ENCOUNTER — Other Ambulatory Visit: Payer: Self-pay | Admitting: Family Medicine

## 2017-06-10 DIAGNOSIS — Z1231 Encounter for screening mammogram for malignant neoplasm of breast: Secondary | ICD-10-CM

## 2017-07-07 ENCOUNTER — Ambulatory Visit
Admission: RE | Admit: 2017-07-07 | Discharge: 2017-07-07 | Disposition: A | Discharge status: Home / Self Care | Payer: BC Managed Care – PPO | Source: Ambulatory Visit | Attending: Family Medicine | Admitting: Family Medicine

## 2017-07-07 DIAGNOSIS — Z1231 Encounter for screening mammogram for malignant neoplasm of breast: Secondary | ICD-10-CM

## 2017-11-09 ENCOUNTER — Telehealth: Payer: BC Managed Care – PPO | Admitting: Family

## 2017-11-09 DIAGNOSIS — J019 Acute sinusitis, unspecified: Secondary | ICD-10-CM

## 2017-11-09 DIAGNOSIS — B9689 Other specified bacterial agents as the cause of diseases classified elsewhere: Secondary | ICD-10-CM

## 2017-11-09 MED ORDER — AMOXICILLIN-POT CLAVULANATE 875-125 MG PO TABS: 1.0000 | ORAL_TABLET | Freq: Two times a day (BID) | ORAL | 0 refills | Status: DC

## 2017-11-09 NOTE — Progress Notes (Signed)

## 2017-12-09 ENCOUNTER — Other Ambulatory Visit: Payer: Self-pay | Admitting: Family Medicine

## 2017-12-20 ENCOUNTER — Other Ambulatory Visit: Payer: Self-pay | Admitting: Family Medicine

## 2018-04-12 ENCOUNTER — Encounter: Payer: Self-pay | Admitting: Family Medicine

## 2018-04-12 ENCOUNTER — Other Ambulatory Visit: Payer: Self-pay

## 2018-04-12 ENCOUNTER — Ambulatory Visit (INDEPENDENT_AMBULATORY_CARE_PROVIDER_SITE_OTHER): Payer: BC Managed Care – PPO | Admitting: Family Medicine

## 2018-04-12 VITALS — BP 121/82 | HR 74 | Temp 98.4°F | Resp 16 | Ht 68.0 in | Wt 202.0 lb

## 2018-04-12 DIAGNOSIS — Z0001 Encounter for general adult medical examination with abnormal findings: Secondary | ICD-10-CM

## 2018-04-12 DIAGNOSIS — E669 Obesity, unspecified: Secondary | ICD-10-CM | POA: Diagnosis not present

## 2018-04-12 DIAGNOSIS — L84 Corns and callosities: Secondary | ICD-10-CM

## 2018-04-12 DIAGNOSIS — S90852A Superficial foreign body, left foot, initial encounter: Secondary | ICD-10-CM | POA: Diagnosis not present

## 2018-04-12 DIAGNOSIS — Z0000 Encounter for general adult medical examination without abnormal findings: Secondary | ICD-10-CM | POA: Diagnosis not present

## 2018-04-12 DIAGNOSIS — Z23 Encounter for immunization: Secondary | ICD-10-CM

## 2018-04-12 LAB — CBC WITH DIFFERENTIAL/PLATELET
BASOS ABS: 0 10*3/uL (ref 0.0–0.1)
Basophils Relative: 0.7 % (ref 0.0–3.0)
EOS PCT: 2.1 % (ref 0.0–5.0)
Eosinophils Absolute: 0.1 10*3/uL (ref 0.0–0.7)
HCT: 39.3 % (ref 36.0–46.0)
HEMOGLOBIN: 13.7 g/dL (ref 12.0–15.0)
LYMPHS ABS: 1.3 10*3/uL (ref 0.7–4.0)
Lymphocytes Relative: 24.3 % (ref 12.0–46.0)
MCHC: 34.8 g/dL (ref 30.0–36.0)
MCV: 88.5 fl (ref 78.0–100.0)
MONO ABS: 0.4 10*3/uL (ref 0.1–1.0)
MONOS PCT: 7.7 % (ref 3.0–12.0)
NEUTROS PCT: 65.2 % (ref 43.0–77.0)
Neutro Abs: 3.4 10*3/uL (ref 1.4–7.7)
Platelets: 302 10*3/uL (ref 150.0–400.0)
RBC: 4.45 Mil/uL (ref 3.87–5.11)
RDW: 13.1 % (ref 11.5–15.5)
WBC: 5.2 10*3/uL (ref 4.0–10.5)

## 2018-04-12 LAB — LIPID PANEL
CHOLESTEROL: 206 mg/dL — AB (ref 0–200)
HDL: 45.4 mg/dL (ref >39.00)
LDL Cholesterol: 128 mg/dL — ABNORMAL HIGH (ref 0–99)
NonHDL: 160.68
Total CHOL/HDL Ratio: 5
Triglycerides: 164 mg/dL — ABNORMAL HIGH (ref 0.0–149.0)
VLDL: 32.8 mg/dL (ref 0.0–40.0)

## 2018-04-12 LAB — BASIC METABOLIC PANEL
BUN: 17 mg/dL (ref 6–23)
CHLORIDE: 104 meq/L (ref 96–112)
CO2: 27 mEq/L (ref 19–32)
CREATININE: 0.77 mg/dL (ref 0.40–1.20)
Calcium: 9.9 mg/dL (ref 8.4–10.5)
GFR: 83.66 mL/min (ref >60.00)
GLUCOSE: 99 mg/dL (ref 70–99)
Potassium: 4.2 mEq/L (ref 3.5–5.1)
Sodium: 139 mEq/L (ref 135–145)

## 2018-04-12 LAB — HEPATIC FUNCTION PANEL
ALBUMIN: 4.5 g/dL (ref 3.5–5.2)
ALK PHOS: 78 U/L (ref 39–117)
ALT: 22 U/L (ref 0–35)
AST: 22 U/L (ref 0–37)
BILIRUBIN DIRECT: 0.1 mg/dL (ref 0.0–0.3)
BILIRUBIN TOTAL: 0.9 mg/dL (ref 0.2–1.2)
TOTAL PROTEIN: 6.9 g/dL (ref 6.0–8.3)

## 2018-04-12 LAB — TSH: TSH: 2.52 u[IU]/mL (ref 0.35–4.50)

## 2018-04-12 NOTE — Patient Instructions (Addendum)
Follow up in 6 months to recheck weight loss progress- sooner if needed We'll notify you of your lab results and make any changes if needed Continue to work on healthy diet and regular exercise- you can do it! If the foot pain doesn't improve, let me know and we'll send you to Sports Medicine Call with any questions or concerns Have a great summer!!

## 2018-04-12 NOTE — Progress Notes (Signed)
   Subjective:    Patient ID: Dana Zavala, female    DOB: 02-14-1966, 52 y.o.   MRN: [id number]  HPI CPE- UTD on pap, mammo, colonoscopy.  Due for Tdap.  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   Review of Systems Patient reports no vision/ hearing changes, adenopathy,fever, weight change,  persistant/recurrent hoarseness , swallowing issues, chest pain, palpitations, edema, persistant/recurrent cough, hemoptysis, dyspnea (rest/exertional/paroxysmal nocturnal), gastrointestinal bleeding (melena, rectal bleeding), abdominal pain, significant heartburn, bowel changes, GU symptoms (dysuria, hematuria, incontinence), Gyn symptoms (abnormal  bleeding, pain),  syncope, focal weakness, memory loss, numbness & tingling, hair/nail changes, abnormal bruising or bleeding, anxiety, or depression.   L foot pain- worse in AM, described as a throbbing pain. 'plantar warts'- bilaterally, L foot w/ hard center surrounded by callous on L MTP joint    Objective:   Physical Exam General Appearance:    Alert, cooperative, no distress, appears stated age, obese  Head:    Normocephalic, without obvious abnormality, atraumatic  Eyes:    PERRL, conjunctiva/corneas clear, EOM's intact, fundi    benign, both eyes  Ears:    Normal TM's and external ear canals, both ears  Nose:   Nares normal, septum midline, mucosa normal, no drainage    or sinus tenderness  Throat:   Lips, mucosa, and tongue normal; teeth and gums normal  Neck:   Supple, symmetrical, trachea midline, no adenopathy;    Thyroid: no enlargement/tenderness/nodules  Back:     Symmetric, no curvature, ROM normal, no CVA tenderness  Lungs:     Clear to auscultation bilaterally, respirations unlabored  Chest Wall:    No tenderness or deformity   Heart:    Regular rate and rhythm, S1 and S2 normal, no murmur, rub   or gallop  Breast Exam:    Deferred to mammo  Abdomen:     Soft, non-tender, bowel sounds active all  four quadrants,    no masses, no organomegaly  Genitalia:    Deferred  Rectal:    Extremities:   Extremities normal, atraumatic, no cyanosis or edema.  L foot w/ plastic foreign body in plantar surface over 5th MTP joint- removed w/ forceps, R foot w/ corn/callous  Pulses:   2+ and symmetric all extremities  Skin:   Skin color, texture, turgor normal, no rashes or lesions  Lymph nodes:   Cervical, supraclavicular, and axillary nodes normal  Neurologic:   CNII-XII intact, normal strength, sensation and reflexes    throughout          Assessment & Plan:  Foreign body of foot- pt w/ hard plastic piece in bottom of L foot.  Removed w/ forceps.  Pt tolerated w/o difficulty  Corn/callous- new.  Skin was pared down using forceps.  Pt to apply OTC corn pads.  Pt expressed understanding and is in agreement w/ plan.

## 2018-04-13 ENCOUNTER — Encounter: Payer: Self-pay | Admitting: General Practice

## 2018-04-17 NOTE — Assessment & Plan Note (Signed)
Ongoing issue for pt.  Stressed need for healthy diet and regular exercise.  Will check labs to risk stratify. 

## 2018-04-17 NOTE — Assessment & Plan Note (Signed)
PE WNL w/ exception of foreign body in L foot.  UTD on pap, mammo, colonoscopy.  Tdap given.  Check labs.  Anticipatory guidance provided.

## 2018-05-25 ENCOUNTER — Other Ambulatory Visit: Payer: Self-pay | Admitting: Family Medicine

## 2018-05-25 DIAGNOSIS — Z1231 Encounter for screening mammogram for malignant neoplasm of breast: Secondary | ICD-10-CM

## 2018-06-01 ENCOUNTER — Encounter: Payer: Self-pay | Admitting: Family Medicine

## 2018-06-07 ENCOUNTER — Other Ambulatory Visit: Payer: Self-pay | Admitting: General Practice

## 2018-06-07 MED ORDER — VENLAFAXINE HCL ER 150 MG PO CP24: 150.0000 mg | ORAL_CAPSULE | Freq: Every day | ORAL | 1 refills | Status: DC

## 2018-07-11 ENCOUNTER — Ambulatory Visit
Admission: RE | Admit: 2018-07-11 | Discharge: 2018-07-11 | Disposition: A | Discharge status: Home / Self Care | Payer: BC Managed Care – PPO | Source: Ambulatory Visit | Attending: Family Medicine | Admitting: Family Medicine

## 2018-07-11 DIAGNOSIS — Z1231 Encounter for screening mammogram for malignant neoplasm of breast: Secondary | ICD-10-CM

## 2018-10-04 ENCOUNTER — Ambulatory Visit: Payer: BC Managed Care – PPO | Admitting: Family Medicine

## 2018-10-05 ENCOUNTER — Other Ambulatory Visit: Payer: Self-pay | Admitting: General Practice

## 2018-10-05 MED ORDER — VENLAFAXINE HCL ER 150 MG PO CP24: 150.0000 mg | ORAL_CAPSULE | Freq: Every day | ORAL | 1 refills | Status: DC

## 2019-01-17 IMAGING — MG DIGITAL SCREENING BILATERAL MAMMOGRAM WITH TOMO AND CAD
8 series · 8 of 24 positions shown · non-contrast
Comparison: Previous exam(s).

CLINICAL DATA: Screening.

EXAM:
DIGITAL SCREENING BILATERAL MAMMOGRAM WITH TOMO AND CAD

[L MLO synth-2D]
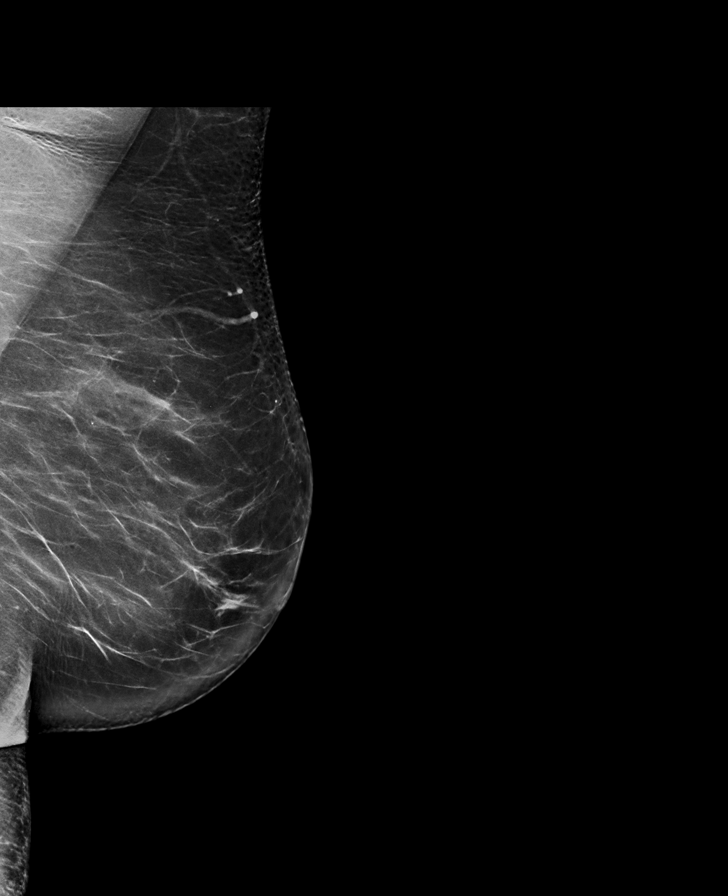

[L CC synth-2D]
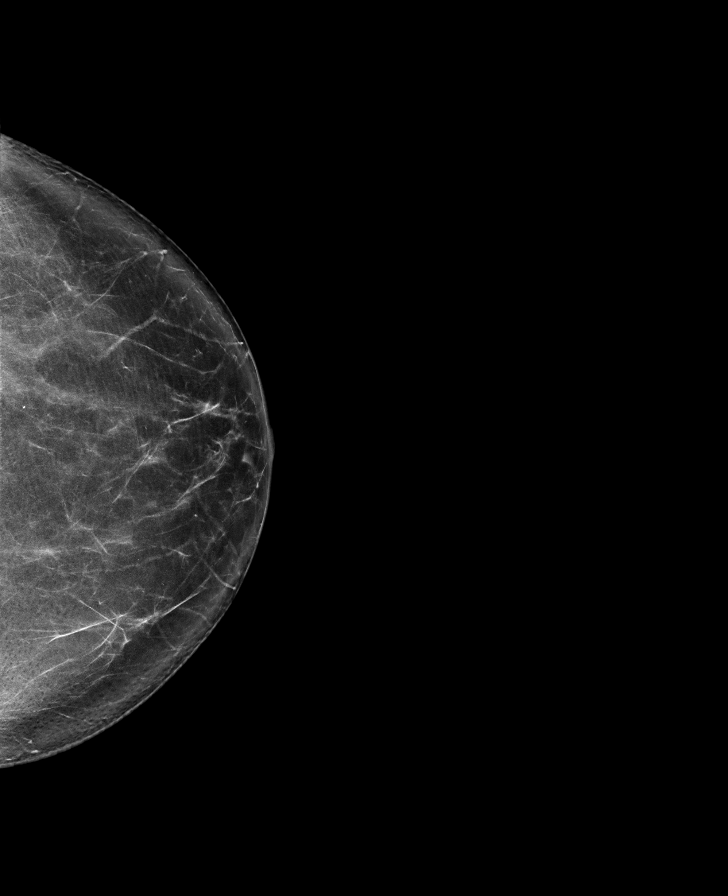

[R CC synth-2D]
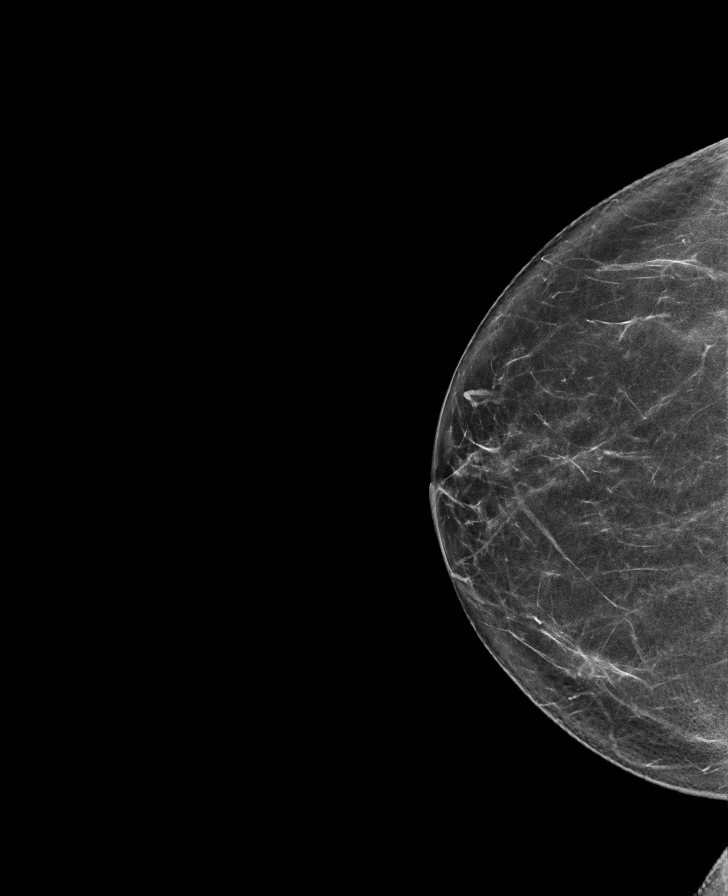

[R MLO synth-2D]
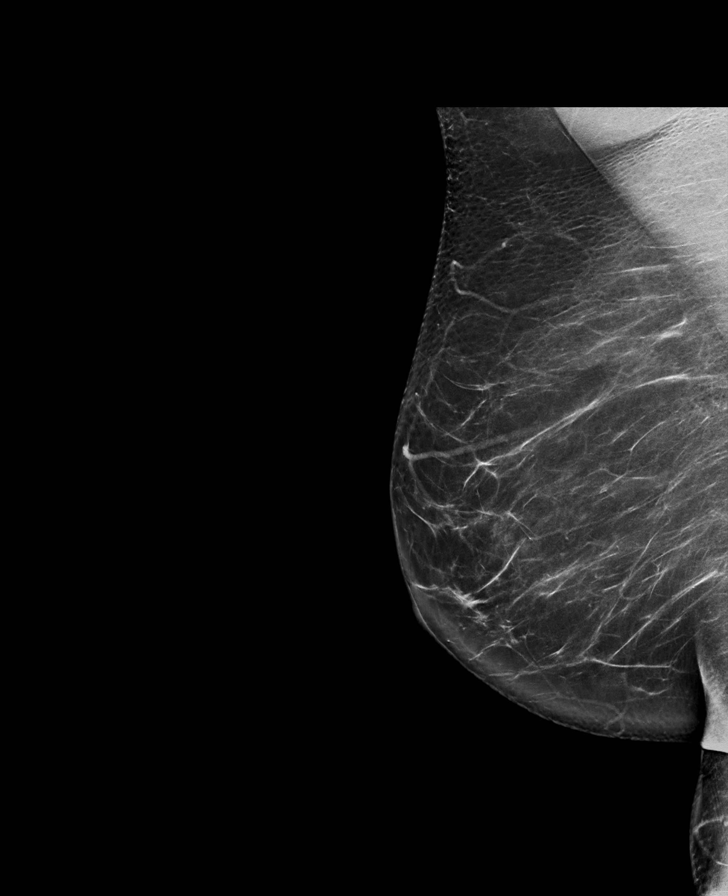

[R CC tomo · tomo slice 37/73.0]
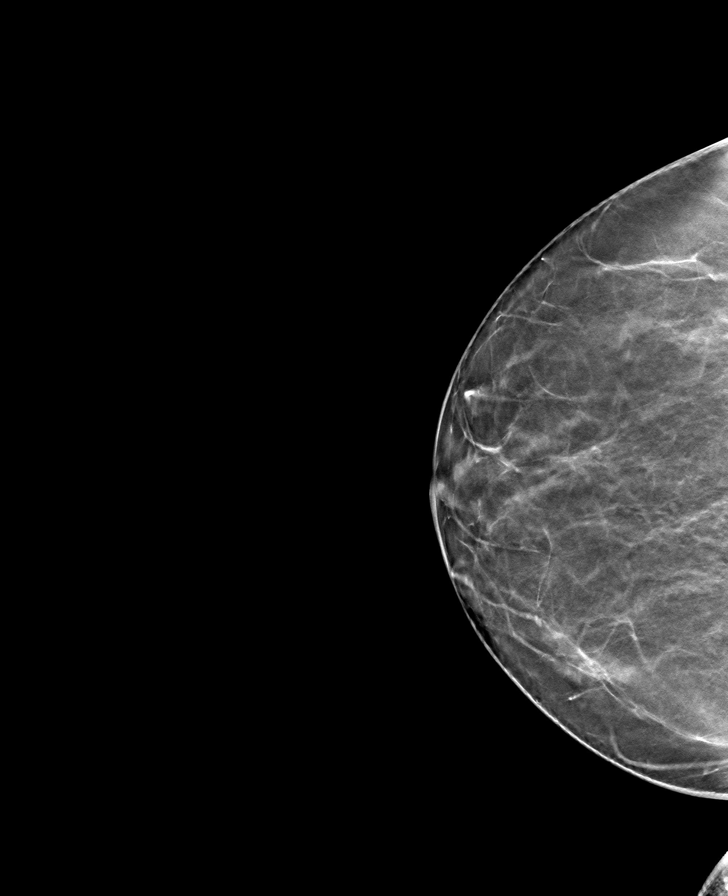

[L CC tomo · tomo slice 41/81.0]
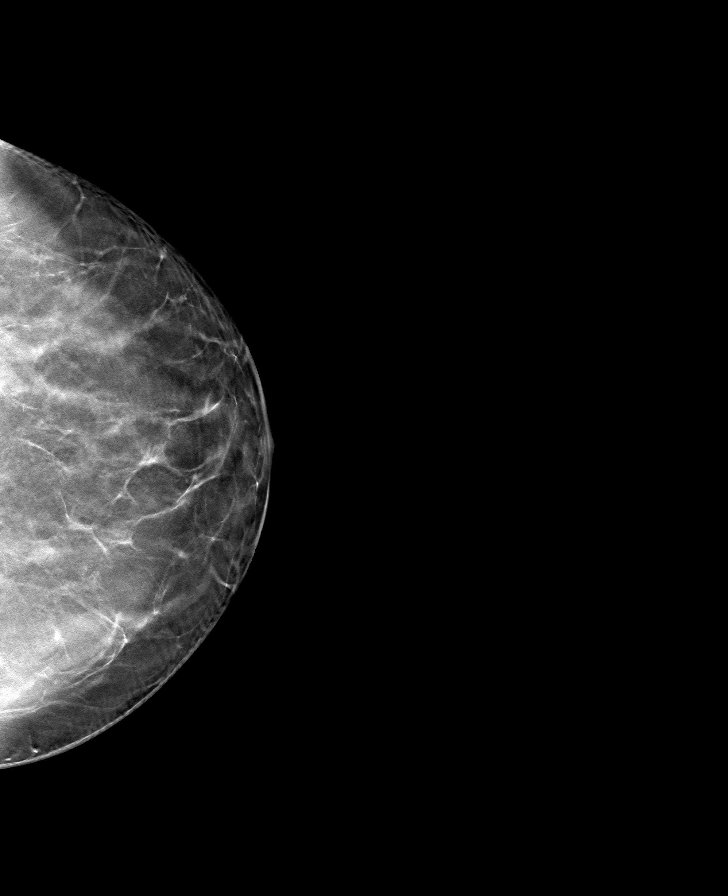

[L MLO tomo · tomo slice 46/91.0]
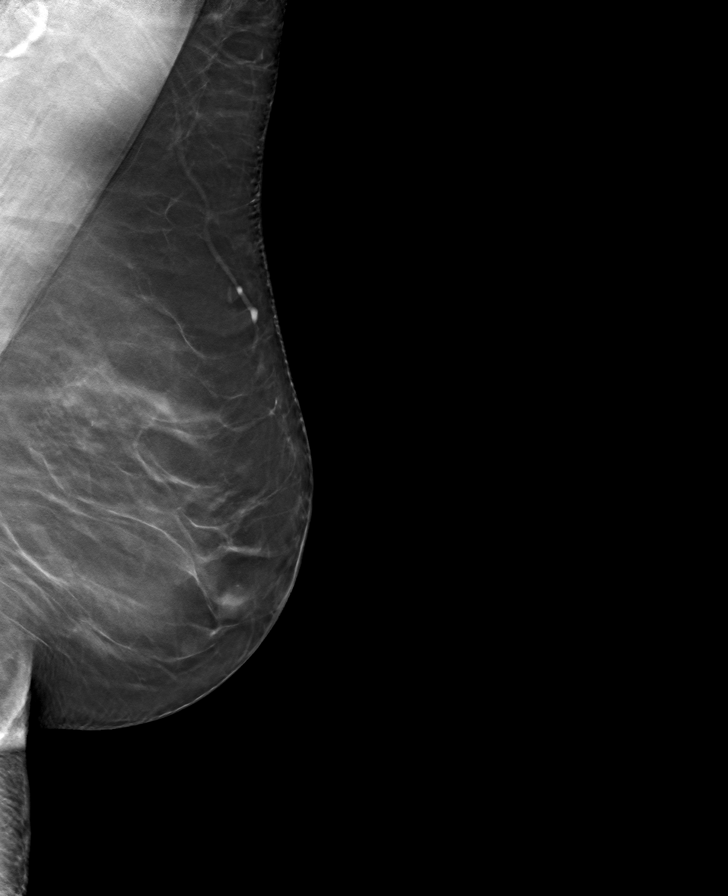

[R MLO tomo · tomo slice 44/87.0]
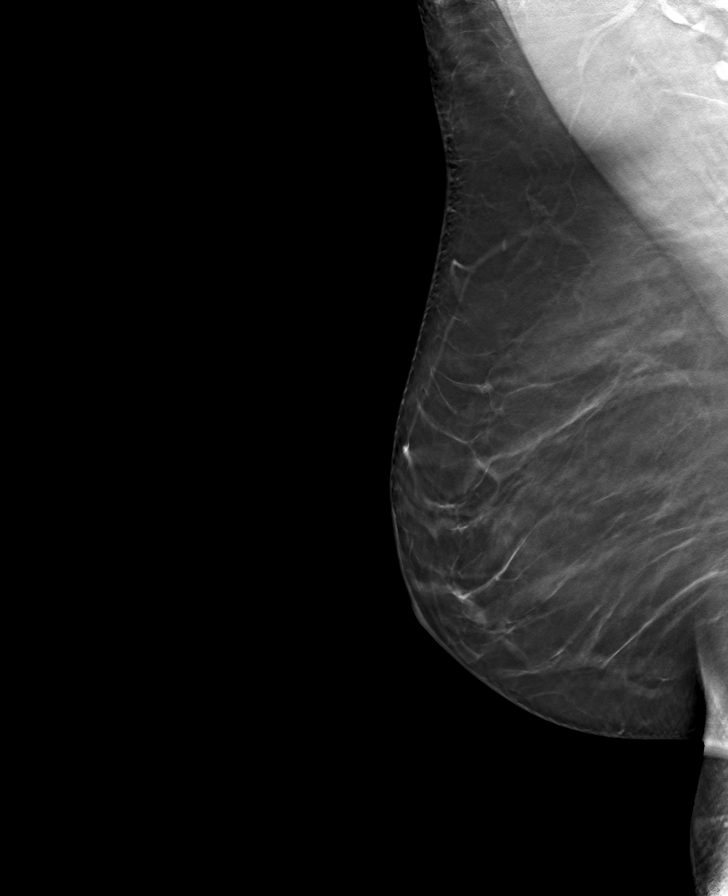

[8 of 24 positions shown; findings below may reference images not displayed]

ACR Breast Density Category b: There are scattered areas of
fibroglandular density.
FINDINGS: There are no findings suspicious for malignancy. Images were
processed with CAD.
IMPRESSION: No mammographic evidence of malignancy. A result letter of this
screening mammogram will be mailed directly to the patient.

RECOMMENDATION:
Screening mammogram in one year. (Code:[id number])

BI-RADS CATEGORY  1: Negative.

## 2019-04-26 ENCOUNTER — Encounter: Payer: BC Managed Care – PPO | Admitting: Family Medicine

## 2019-04-27 ENCOUNTER — Other Ambulatory Visit: Payer: Self-pay

## 2019-04-27 ENCOUNTER — Encounter: Payer: Self-pay | Admitting: Family Medicine

## 2019-04-27 ENCOUNTER — Ambulatory Visit (INDEPENDENT_AMBULATORY_CARE_PROVIDER_SITE_OTHER): Payer: BC Managed Care – PPO | Admitting: Family Medicine

## 2019-04-27 VITALS — BP 118/80 | HR 80 | Temp 98.1°F | Resp 16 | Ht 68.0 in | Wt 197.4 lb

## 2019-04-27 DIAGNOSIS — E669 Obesity, unspecified: Secondary | ICD-10-CM | POA: Diagnosis not present

## 2019-04-27 DIAGNOSIS — F32A Depression, unspecified: Secondary | ICD-10-CM | POA: Insufficient documentation

## 2019-04-27 DIAGNOSIS — L989 Disorder of the skin and subcutaneous tissue, unspecified: Secondary | ICD-10-CM | POA: Diagnosis not present

## 2019-04-27 DIAGNOSIS — Z0000 Encounter for general adult medical examination without abnormal findings: Secondary | ICD-10-CM

## 2019-04-27 DIAGNOSIS — F329 Major depressive disorder, single episode, unspecified: Secondary | ICD-10-CM

## 2019-04-27 DIAGNOSIS — F419 Anxiety disorder, unspecified: Secondary | ICD-10-CM

## 2019-04-27 LAB — BASIC METABOLIC PANEL
BUN: 13 mg/dL (ref 6–23)
CO2: 28 mEq/L (ref 19–32)
Calcium: 9.7 mg/dL (ref 8.4–10.5)
Chloride: 104 mEq/L (ref 96–112)
Creatinine, Ser: 0.8 mg/dL (ref 0.40–1.20)
GFR: 75.01 mL/min (ref >60.00)
Glucose, Bld: 88 mg/dL (ref 70–99)
Potassium: 4.6 mEq/L (ref 3.5–5.1)
Sodium: 140 mEq/L (ref 135–145)

## 2019-04-27 LAB — LIPID PANEL
Cholesterol: 243 mg/dL — ABNORMAL HIGH (ref 0–200)
HDL: 45.6 mg/dL (ref >39.00)
NonHDL: 197.32
Total CHOL/HDL Ratio: 5
Triglycerides: 257 mg/dL — ABNORMAL HIGH (ref 0.0–149.0)
VLDL: 51.4 mg/dL — ABNORMAL HIGH (ref 0.0–40.0)

## 2019-04-27 LAB — CBC WITH DIFFERENTIAL/PLATELET
Basophils Absolute: 0 10*3/uL (ref 0.0–0.1)
Basophils Relative: 0.5 % (ref 0.0–3.0)
Eosinophils Absolute: 0.1 10*3/uL (ref 0.0–0.7)
Eosinophils Relative: 2.1 % (ref 0.0–5.0)
HCT: 40.6 % (ref 36.0–46.0)
Hemoglobin: 14.1 g/dL (ref 12.0–15.0)
Lymphocytes Relative: 29.8 % (ref 12.0–46.0)
Lymphs Abs: 1.7 10*3/uL (ref 0.7–4.0)
MCHC: 34.9 g/dL (ref 30.0–36.0)
MCV: 88.1 fl (ref 78.0–100.0)
Monocytes Absolute: 0.4 10*3/uL (ref 0.1–1.0)
Monocytes Relative: 6.6 % (ref 3.0–12.0)
Neutro Abs: 3.4 10*3/uL (ref 1.4–7.7)
Neutrophils Relative %: 61 % (ref 43.0–77.0)
Platelets: 300 10*3/uL (ref 150.0–400.0)
RBC: 4.61 Mil/uL (ref 3.87–5.11)
RDW: 12.7 % (ref 11.5–15.5)
WBC: 5.6 10*3/uL (ref 4.0–10.5)

## 2019-04-27 LAB — TSH: TSH: 3.61 u[IU]/mL (ref 0.35–4.50)

## 2019-04-27 LAB — HEPATIC FUNCTION PANEL
ALT: 20 U/L (ref 0–35)
AST: 20 U/L (ref 0–37)
Albumin: 4.8 g/dL (ref 3.5–5.2)
Alkaline Phosphatase: 83 U/L (ref 39–117)
Bilirubin, Direct: 0.1 mg/dL (ref 0.0–0.3)
Total Bilirubin: 0.7 mg/dL (ref 0.2–1.2)
Total Protein: 7.2 g/dL (ref 6.0–8.3)

## 2019-04-27 LAB — VITAMIN D 25 HYDROXY (VIT D DEFICIENCY, FRACTURES): VITD: 42.71 ng/mL (ref 30.00–100.00)

## 2019-04-27 LAB — LDL CHOLESTEROL, DIRECT: Direct LDL: 167 mg/dL

## 2019-04-27 MED ORDER — TRIAMCINOLONE ACETONIDE 0.1 % EX CREA: 1.0000 "application " | TOPICAL_CREAM | Freq: Two times a day (BID) | CUTANEOUS | 1 refills | Status: DC

## 2019-04-27 MED ORDER — VENLAFAXINE HCL ER 150 MG PO CP24: 150.0000 mg | ORAL_CAPSULE | Freq: Every day | ORAL | 1 refills | Status: DC

## 2019-04-27 MED ORDER — BUPROPION HCL 75 MG PO TABS: 75.0000 mg | ORAL_TABLET | Freq: Two times a day (BID) | ORAL | 3 refills | Status: DC

## 2019-04-27 NOTE — Patient Instructions (Addendum)
Follow up in 4-6 weeks to recheck mood We'll notify you of your lab results and make any changes if needed We'll call you with your Derm appt Continue the Venlafaxine daily ADD the Bupropion (Wellbutrin) 75mg  twice daily. ADD Black Cohosh to help w/ hot flashes Call with any questions or concerns Hang in there!  Stay Safe!

## 2019-04-27 NOTE — Assessment & Plan Note (Signed)
Pt's PE WNL w/ exception of obesity.  UTD on immunizations, mammo, colonoscopy.  Will complete pap when pt is comfortable.  Check labs.  Anticipatory guidance provided.

## 2019-04-27 NOTE — Assessment & Plan Note (Signed)
Ongoing issue.  Pt is down 5 lbs since last visit.  Will check labs to risk stratify.

## 2019-04-27 NOTE — Progress Notes (Signed)
   Subjective:    Patient ID: Dana Zavala, female    DOB: Nov 21, 1965, 53 y.o.   MRN: [id number]  HPI CPE- UTD on mammo, colonoscopy, immunizations.  Due for pap but defers due to COVID.   Review of Systems Patient reports no vision/ hearing changes, adenopathy,fever, weight change,  persistant/recurrent hoarseness , swallowing issues, chest pain, palpitations, edema, persistant/recurrent cough, hemoptysis, dyspnea (rest/exertional/paroxysmal nocturnal), gastrointestinal bleeding (melena, rectal bleeding), abdominal pain, significant heartburn, bowel changes, GU symptoms (dysuria, hematuria, incontinence), Gyn symptoms (abnormal  bleeding, pain),  syncope, focal weakness, memory loss, numbness & tingling, skin/hair/nail changes, abnormal bruising or bleeding.  + anxiety/depression- pt is angry w/ what's going on globally.  Was upset that she could not go to [facility] due to [place].  Husband is 'very hard to deal with'.    Objective:   Physical Exam General Appearance:    Alert, cooperative, no distress, appears stated age  Head:    Normocephalic, without obvious abnormality, atraumatic  Eyes:    PERRL, conjunctiva/corneas clear, EOM's intact, fundi    benign, both eyes  Ears:    Normal TM's and external ear canals, both ears  Nose:   Nares normal, septum midline, mucosa normal, no drainage    or sinus tenderness  Throat:   Lips, mucosa, and tongue normal; teeth and gums normal  Neck:   Supple, symmetrical, trachea midline, no adenopathy;    Thyroid: no enlargement/tenderness/nodules  Back:     Symmetric, no curvature, ROM normal, no CVA tenderness  Lungs:     Clear to auscultation bilaterally, respirations unlabored  Chest Wall:    No tenderness or deformity   Heart:    Regular rate and rhythm, S1 and S2 normal, no murmur, rub   or gallop  Breast Exam:    Deferred to mammo  Abdomen:     Soft, non-tender, bowel sounds active all four quadrants,    no masses, no organomegaly   Genitalia:    Deferred  Rectal:    Extremities:   Extremities normal, atraumatic, no cyanosis or edema  Pulses:   2+ and symmetric all extremities  Skin:   Skin color, texture, turgor normal, no rashes or lesions  Lymph nodes:   Cervical, supraclavicular, and axillary nodes normal  Neurologic:   CNII-XII intact, normal strength, sensation and reflexes    throughout          Assessment & Plan:

## 2019-04-27 NOTE — Assessment & Plan Note (Signed)
New.  Pt is already on Venlafaxine due to hot flashes and menopausal sxs.  Will add low dose Wellbutrin to improve mood, increase metabolism (pt is very concerned about her weight).  Pt will return in 4-6 weeks to determine if medication is working and if dose adjustment is needed

## 2019-04-28 ENCOUNTER — Other Ambulatory Visit: Payer: Self-pay | Admitting: General Practice

## 2019-04-28 MED ORDER — ATORVASTATIN CALCIUM 20 MG PO TABS: 20.0000 mg | ORAL_TABLET | Freq: Every day | ORAL | 6 refills | Status: DC

## 2019-04-30 ENCOUNTER — Encounter: Payer: Self-pay | Admitting: Family Medicine

## 2019-05-01 ENCOUNTER — Other Ambulatory Visit: Payer: Self-pay | Admitting: General Practice

## 2019-05-01 MED ORDER — ATORVASTATIN CALCIUM 20 MG PO TABS: 20.0000 mg | ORAL_TABLET | Freq: Every day | ORAL | 6 refills | Status: DC

## 2019-06-01 ENCOUNTER — Ambulatory Visit (INDEPENDENT_AMBULATORY_CARE_PROVIDER_SITE_OTHER): Payer: BC Managed Care – PPO | Admitting: Family Medicine

## 2019-06-01 ENCOUNTER — Encounter: Payer: Self-pay | Admitting: Family Medicine

## 2019-06-01 ENCOUNTER — Ambulatory Visit: Payer: BC Managed Care – PPO | Admitting: Family Medicine

## 2019-06-01 ENCOUNTER — Other Ambulatory Visit: Payer: Self-pay

## 2019-06-01 VITALS — BP 125/76 | Temp 97.7°F | Ht 68.0 in | Wt 196.0 lb

## 2019-06-01 DIAGNOSIS — F329 Major depressive disorder, single episode, unspecified: Secondary | ICD-10-CM | POA: Diagnosis not present

## 2019-06-01 DIAGNOSIS — F419 Anxiety disorder, unspecified: Secondary | ICD-10-CM | POA: Diagnosis not present

## 2019-06-01 DIAGNOSIS — E785 Hyperlipidemia, unspecified: Secondary | ICD-10-CM | POA: Diagnosis not present

## 2019-06-01 DIAGNOSIS — F32A Depression, unspecified: Secondary | ICD-10-CM

## 2019-06-01 MED ORDER — BUPROPION HCL ER (XL) 150 MG PO TB24: 150.0000 mg | ORAL_TABLET | Freq: Every day | ORAL | 3 refills | Status: DC

## 2019-06-01 NOTE — Assessment & Plan Note (Signed)
New.  Pt's last labs showed a dramatic increase in total cholesterol and LDL.  Pt is tolerating statin but is wondering what steps she can take to lower her cholesterol.  Discussed whole grains, low fat diet, increased fruits and veggies, and regular exercise.  Will follow.

## 2019-06-01 NOTE — Progress Notes (Signed)
I have discussed the procedure for the virtual visit with the patient who has given consent to proceed with assessment and treatment.   Talene Glastetter L Claudell Rhody, CMA     

## 2019-06-01 NOTE — Progress Notes (Signed)
   Virtual Visit via Video   I connected with patient on 06/01/19 at 11:30 AM EDT by a video enabled telemedicine application and verified that I am speaking with the correct person using two identifiers.  Location patient: Home Location provider: [facility], Office Persons participating in the virtual visit: Patient, Provider, [place] (Jess B)  I discussed the limitations of evaluation and management by telemedicine and the availability of in person appointments. The patient expressed understanding and agreed to proceed.  Subjective:   HPI:   Anxiety/Depression- pt has been on Effexor for years but low dose Wellbutrin was added at last visit.  Pt doesn't feel any different since adding the medication.  'I just really don't notice a difference between taking and not taking'.  'i'm ok.  i'm not desperate or anything'.    Hyperlipidemia- her cholesterol results were very surprising to her.  She reports she is working on her diet and has started walking more.  She wants to know what can be done.  ROS:   See pertinent positives and negatives per HPI.  Patient Active Problem List   Diagnosis Date Noted  . Anxiety and depression 04/27/2019  . Physical exam 04/06/2016  . Pap smear for cervical cancer screening 04/06/2016  . Family history of liver disease 08/26/2015  . Obesity (BMI 30.0-34.9) 08/26/2015  . Menopause syndrome 08/26/2015  . Insomnia 08/26/2015    Social History   Tobacco Use  . Smoking status: Never Smoker  . Smokeless tobacco: Never Used  Substance Use Topics  . Alcohol use: No    Alcohol/week: 0.0 standard drinks    Current Outpatient Medications:  .  atorvastatin (LIPITOR) 20 MG tablet, Take 1 tablet (20 mg total) by mouth daily., Disp: 30 tablet, Rfl: 6 .  buPROPion (WELLBUTRIN) 75 MG tablet, Take 1 tablet (75 mg total) by mouth 2 (two) times daily., Disp: 60 tablet, Rfl: 3 .  Calcium-Magnesium-Vitamin D (CALCIUM 500 PO), Take 1 tablet by mouth daily.,  Disp: , Rfl:  .  Multiple Vitamin (MULTIVITAMIN) tablet, Take 1 tablet by mouth daily., Disp: , Rfl:  .  triamcinolone cream (KENALOG) 0.1 %, Apply 1 application topically 2 (two) times daily., Disp: 80 g, Rfl: 1 .  venlafaxine XR (EFFEXOR-XR) 150 MG 24 hr capsule, Take 1 capsule (150 mg total) by mouth daily with breakfast., Disp: 90 capsule, Rfl: 1  No Known Allergies  Objective:   BP 125/76   Temp 97.7 F (36.5 C) (Oral)   Ht 5\' 8"  (2.951 m)   Wt 196 lb (88.9 kg)   LMP 03/13/2011   BMI 29.80 kg/m   AAOx3, NAD NCAT, EOMI No obvious CN deficits Coloring WNL Pt is able to speak clearly, coherently without shortness of breath or increased work of breathing.  Thought process is linear.  Mood is appropriate.   Assessment and Plan:   See problem based charting   Annye Asa, MD 06/01/2019

## 2019-06-01 NOTE — Assessment & Plan Note (Signed)
Unchanged from previous.  Addition of low dose Wellbutrin was not effective.  Increase to XL 150mg  daily and monitor for improvement.  Encouraged regular activity and other lifestyle changes to boost mood.  Will follow.

## 2019-06-08 ENCOUNTER — Other Ambulatory Visit: Payer: Self-pay | Admitting: Family Medicine

## 2019-06-08 DIAGNOSIS — Z1231 Encounter for screening mammogram for malignant neoplasm of breast: Secondary | ICD-10-CM

## 2019-06-13 ENCOUNTER — Telehealth: Payer: Self-pay | Admitting: Family Medicine

## 2019-06-13 NOTE — Telephone Encounter (Signed)
LM for pt to call back to schedule a 4-6 wk reck mood from her appt on 06/01/2019 with Tabori.

## 2019-06-22 ENCOUNTER — Other Ambulatory Visit: Payer: Self-pay | Admitting: Physician Assistant

## 2019-06-22 ENCOUNTER — Encounter: Payer: Self-pay | Admitting: Family Medicine

## 2019-07-24 ENCOUNTER — Other Ambulatory Visit: Payer: Self-pay

## 2019-07-24 ENCOUNTER — Ambulatory Visit
Admission: RE | Admit: 2019-07-24 | Discharge: 2019-07-24 | Disposition: A | Discharge status: Home / Self Care | Payer: BC Managed Care – PPO | Source: Ambulatory Visit | Attending: Family Medicine | Admitting: Family Medicine

## 2019-07-24 DIAGNOSIS — Z1231 Encounter for screening mammogram for malignant neoplasm of breast: Secondary | ICD-10-CM

## 2019-08-02 ENCOUNTER — Other Ambulatory Visit: Payer: Self-pay | Admitting: Family Medicine

## 2019-08-16 ENCOUNTER — Other Ambulatory Visit: Payer: Self-pay | Admitting: Family Medicine

## 2019-09-09 ENCOUNTER — Other Ambulatory Visit: Payer: Self-pay | Admitting: Family Medicine

## 2019-10-03 ENCOUNTER — Other Ambulatory Visit: Payer: Self-pay | Admitting: Family Medicine

## 2019-10-25 ENCOUNTER — Other Ambulatory Visit: Payer: Self-pay | Admitting: Family Medicine

## 2020-01-18 ENCOUNTER — Other Ambulatory Visit: Payer: Self-pay | Admitting: Family Medicine

## 2020-02-20 ENCOUNTER — Other Ambulatory Visit: Payer: Self-pay | Admitting: Family Medicine

## 2020-04-21 ENCOUNTER — Other Ambulatory Visit: Payer: Self-pay | Admitting: Family Medicine

## 2020-04-29 ENCOUNTER — Other Ambulatory Visit: Payer: Self-pay | Admitting: Family Medicine

## 2020-05-06 ENCOUNTER — Encounter: Payer: Self-pay | Admitting: Family Medicine

## 2020-05-06 ENCOUNTER — Other Ambulatory Visit: Payer: Self-pay

## 2020-05-06 ENCOUNTER — Other Ambulatory Visit (HOSPITAL_COMMUNITY)
Admission: RE | Admit: 2020-05-06 | Discharge: 2020-05-06 | Disposition: A | Discharge status: Home / Self Care | Payer: BC Managed Care – PPO | Source: Ambulatory Visit | Attending: Family Medicine | Admitting: Family Medicine

## 2020-05-06 ENCOUNTER — Ambulatory Visit (INDEPENDENT_AMBULATORY_CARE_PROVIDER_SITE_OTHER): Payer: BC Managed Care – PPO | Admitting: Family Medicine

## 2020-05-06 VITALS — BP 124/83 | HR 75 | Temp 97.8°F | Resp 16 | Ht 68.0 in | Wt 193.0 lb

## 2020-05-06 DIAGNOSIS — Z0000 Encounter for general adult medical examination without abnormal findings: Secondary | ICD-10-CM | POA: Diagnosis not present

## 2020-05-06 DIAGNOSIS — E785 Hyperlipidemia, unspecified: Secondary | ICD-10-CM

## 2020-05-06 DIAGNOSIS — Z124 Encounter for screening for malignant neoplasm of cervix: Secondary | ICD-10-CM | POA: Diagnosis present

## 2020-05-06 LAB — CBC WITH DIFFERENTIAL/PLATELET
Basophils Absolute: 0 10*3/uL (ref 0.0–0.1)
Basophils Relative: 0.7 % (ref 0.0–3.0)
Eosinophils Absolute: 0.2 10*3/uL (ref 0.0–0.7)
Eosinophils Relative: 2.6 % (ref 0.0–5.0)
HCT: 39.6 % (ref 36.0–46.0)
Hemoglobin: 13.9 g/dL (ref 12.0–15.0)
Lymphocytes Relative: 23.6 % (ref 12.0–46.0)
Lymphs Abs: 1.4 10*3/uL (ref 0.7–4.0)
MCHC: 35 g/dL (ref 30.0–36.0)
MCV: 89.5 fl (ref 78.0–100.0)
Monocytes Absolute: 0.5 10*3/uL (ref 0.1–1.0)
Monocytes Relative: 8.2 % (ref 3.0–12.0)
Neutro Abs: 3.9 10*3/uL (ref 1.4–7.7)
Neutrophils Relative %: 64.9 % (ref 43.0–77.0)
Platelets: 276 10*3/uL (ref 150.0–400.0)
RBC: 4.43 Mil/uL (ref 3.87–5.11)
RDW: 12.5 % (ref 11.5–15.5)
WBC: 6 10*3/uL (ref 4.0–10.5)

## 2020-05-06 LAB — LIPID PANEL
Cholesterol: 146 mg/dL (ref 0–200)
HDL: 42.9 mg/dL (ref >39.00)
LDL Cholesterol: 67 mg/dL (ref 0–99)
NonHDL: 102.85
Total CHOL/HDL Ratio: 3
Triglycerides: 178 mg/dL — ABNORMAL HIGH (ref 0.0–149.0)
VLDL: 35.6 mg/dL (ref 0.0–40.0)

## 2020-05-06 LAB — HEPATIC FUNCTION PANEL
ALT: 27 U/L (ref 0–35)
AST: 26 U/L (ref 0–37)
Albumin: 4.5 g/dL (ref 3.5–5.2)
Alkaline Phosphatase: 73 U/L (ref 39–117)
Bilirubin, Direct: 0.1 mg/dL (ref 0.0–0.3)
Total Bilirubin: 0.6 mg/dL (ref 0.2–1.2)
Total Protein: 6.8 g/dL (ref 6.0–8.3)

## 2020-05-06 LAB — BASIC METABOLIC PANEL
BUN: 15 mg/dL (ref 6–23)
CO2: 30 mEq/L (ref 19–32)
Calcium: 9.8 mg/dL (ref 8.4–10.5)
Chloride: 104 mEq/L (ref 96–112)
Creatinine, Ser: 0.92 mg/dL (ref 0.40–1.20)
GFR: 63.59 mL/min (ref >60.00)
Glucose, Bld: 92 mg/dL (ref 70–99)
Potassium: 4 mEq/L (ref 3.5–5.1)
Sodium: 141 mEq/L (ref 135–145)

## 2020-05-06 LAB — TSH: TSH: 4.51 u[IU]/mL — ABNORMAL HIGH (ref 0.35–4.50)

## 2020-05-06 NOTE — Progress Notes (Signed)
   Subjective:    Patient ID: Dana Zavala, female    DOB: December 24, 1965, 54 y.o.   MRN: [id number]  HPI CPE- UTD on mammo, colonoscopy.  Due for pap.  UTD on immunizations (including COVID).  Walking regularly.  Reviewed past medical, surgical, family and social histories.   Review of Systems Patient reports no vision/ hearing changes, adenopathy,fever, weight change,  persistant/recurrent hoarseness , swallowing issues, chest pain, palpitations, edema, persistant/recurrent cough, hemoptysis, dyspnea (rest/exertional/paroxysmal nocturnal), gastrointestinal bleeding (melena, rectal bleeding), abdominal pain, significant heartburn, bowel changes, GU symptoms (dysuria, hematuria, incontinence), Gyn symptoms (abnormal  bleeding, pain),  syncope, focal weakness, memory loss, numbness & tingling, skin/hair/nail changes, abnormal bruising or bleeding, anxiety, or depression.   This visit occurred during the SARS-CoV-2 public health emergency.  Safety protocols were in place, including screening questions prior to the visit, additional usage of staff PPE, and extensive cleaning of exam room while observing appropriate contact time as indicated for disinfecting solutions.       Objective:   Physical Exam  General Appearance:    Alert, cooperative, no distress, appears stated age  Head:    Normocephalic, without obvious abnormality, atraumatic  Eyes:    PERRL, conjunctiva/corneas clear, EOM's intact, fundi    benign, both eyes  Ears:    Normal TM's and external ear canals, both ears  Nose:   Deferred due to COVID  Throat:   Neck:   Supple, symmetrical, trachea midline, no adenopathy;    Thyroid: no enlargement/tenderness/nodules  Back:     Symmetric, no curvature, ROM normal, no CVA tenderness  Lungs:     Clear to auscultation bilaterally, respirations unlabored  Chest Wall:    No tenderness or deformity   Heart:    Regular rate and rhythm, S1 and S2 normal, no murmur, rub   or gallop    Breast Exam:    No tenderness, masses, or nipple abnormality  Abdomen:     Soft, non-tender, bowel sounds active all four quadrants,    no masses, no organomegaly  Genitalia:    External genitalia normal, cervix normal in appearance, no CMT, uterus in normal size and position, adnexa w/out mass or tenderness, mucosa pink and moist, no lesions or discharge present  Rectal:    Normal external appearance  Extremities:   Extremities normal, atraumatic, no cyanosis or edema  Pulses:   2+ and symmetric all extremities  Skin:   Skin color, texture, turgor normal, no rashes or lesions  Lymph nodes:   Cervical, supraclavicular, and axillary nodes normal  Neurologic:   CNII-XII intact, normal strength, sensation and reflexes    throughout          Assessment & Plan:

## 2020-05-06 NOTE — Assessment & Plan Note (Signed)
Chronic problem.  Tolerating statin.  Check labs.  Adjust meds prn  

## 2020-05-06 NOTE — Patient Instructions (Signed)
Follow up in 6 months to recheck cholesterol We'll notify you of your lab results and make any changes if needed Continue to work on healthy diet and regular exercise- you're doing great! Call with any questions or concerns Have a great summer!!! 

## 2020-05-06 NOTE — Assessment & Plan Note (Signed)
Pt's PE WNL.  UTD on mammo, colonoscopy, immunizations.  Pap done today.  Check labs.  Anticipatory guidance provided.  

## 2020-05-06 NOTE — Assessment & Plan Note (Signed)
Pap collected. 

## 2020-05-07 ENCOUNTER — Other Ambulatory Visit: Payer: Self-pay | Admitting: General Practice

## 2020-05-07 DIAGNOSIS — E039 Hypothyroidism, unspecified: Secondary | ICD-10-CM

## 2020-05-07 LAB — CYTOLOGY - PAP
Comment: NEGATIVE
Diagnosis: NEGATIVE
High risk HPV: NEGATIVE

## 2020-05-07 MED ORDER — LEVOTHYROXINE SODIUM 50 MCG PO TABS
50.0000 ug | ORAL_TABLET | Freq: Every day | ORAL | 3 refills | Status: DC
Start: 2020-05-07 — End: 2020-07-29

## 2020-05-07 NOTE — Progress Notes (Signed)
Called pt and lmovm to return call.

## 2020-05-08 ENCOUNTER — Encounter: Payer: Self-pay | Admitting: General Practice

## 2020-06-10 ENCOUNTER — Ambulatory Visit: Payer: BC Managed Care – PPO

## 2020-06-13 ENCOUNTER — Other Ambulatory Visit: Payer: Self-pay | Admitting: Family Medicine

## 2020-06-13 DIAGNOSIS — Z0000 Encounter for general adult medical examination without abnormal findings: Secondary | ICD-10-CM

## 2020-06-17 ENCOUNTER — Other Ambulatory Visit: Payer: Self-pay

## 2020-06-17 ENCOUNTER — Ambulatory Visit (INDEPENDENT_AMBULATORY_CARE_PROVIDER_SITE_OTHER): Payer: BC Managed Care – PPO

## 2020-06-17 DIAGNOSIS — E039 Hypothyroidism, unspecified: Secondary | ICD-10-CM

## 2020-06-18 ENCOUNTER — Encounter: Payer: Self-pay | Admitting: General Practice

## 2020-06-18 LAB — TSH: TSH: 1.64 u[IU]/mL (ref 0.35–4.50)

## 2020-07-24 ENCOUNTER — Other Ambulatory Visit: Payer: Self-pay

## 2020-07-24 ENCOUNTER — Ambulatory Visit
Admission: RE | Admit: 2020-07-24 | Discharge: 2020-07-24 | Disposition: A | Discharge status: Home / Self Care | Payer: BC Managed Care – PPO | Source: Ambulatory Visit | Attending: Family Medicine | Admitting: Family Medicine

## 2020-07-24 DIAGNOSIS — Z0000 Encounter for general adult medical examination without abnormal findings: Secondary | ICD-10-CM

## 2020-07-28 ENCOUNTER — Other Ambulatory Visit: Payer: Self-pay | Admitting: Family Medicine

## 2020-08-01 ENCOUNTER — Other Ambulatory Visit: Payer: Self-pay | Admitting: Family Medicine

## 2020-08-02 ENCOUNTER — Other Ambulatory Visit: Payer: Self-pay | Admitting: General Practice

## 2020-08-02 MED ORDER — VENLAFAXINE HCL ER 150 MG PO CP24
150.0000 mg | ORAL_CAPSULE | Freq: Every day | ORAL | 1 refills | Status: DC
Start: 2020-08-02 — End: 2021-01-29

## 2020-08-02 MED ORDER — ATORVASTATIN CALCIUM 20 MG PO TABS: 20.0000 mg | ORAL_TABLET | Freq: Every day | ORAL | 2 refills | Status: DC

## 2020-08-02 MED ORDER — ATORVASTATIN CALCIUM 20 MG PO TABS
20.0000 mg | ORAL_TABLET | Freq: Every day | ORAL | 2 refills | Status: DC
Start: 2020-08-02 — End: 2021-01-29

## 2020-08-02 MED ORDER — VENLAFAXINE HCL ER 150 MG PO CP24
150.0000 mg | ORAL_CAPSULE | Freq: Every day | ORAL | 1 refills | Status: DC
Start: 2020-08-02 — End: 2020-08-02

## 2021-01-17 ENCOUNTER — Other Ambulatory Visit: Payer: Self-pay | Admitting: Family Medicine

## 2021-01-27 ENCOUNTER — Other Ambulatory Visit: Payer: Self-pay | Admitting: Family Medicine

## 2021-01-28 ENCOUNTER — Other Ambulatory Visit: Payer: Self-pay | Admitting: Family Medicine

## 2021-01-29 ENCOUNTER — Other Ambulatory Visit: Payer: Self-pay

## 2021-01-29 ENCOUNTER — Encounter: Payer: Self-pay | Admitting: Family Medicine

## 2021-01-29 DIAGNOSIS — F32A Depression, unspecified: Secondary | ICD-10-CM

## 2021-01-29 DIAGNOSIS — F419 Anxiety disorder, unspecified: Secondary | ICD-10-CM

## 2021-01-29 MED ORDER — VENLAFAXINE HCL ER 150 MG PO CP24: 150.0000 mg | ORAL_CAPSULE | Freq: Every day | ORAL | 0 refills | Status: DC

## 2021-03-13 ENCOUNTER — Other Ambulatory Visit: Payer: Self-pay | Admitting: Family Medicine

## 2021-03-13 DIAGNOSIS — F419 Anxiety disorder, unspecified: Secondary | ICD-10-CM

## 2021-03-13 DIAGNOSIS — F32A Depression, unspecified: Secondary | ICD-10-CM

## 2021-03-13 NOTE — Telephone Encounter (Signed)
LFD 02/20/20 #80g with 1 refill LOV 05/06/20 NOV 05/08/21

## 2021-03-21 ENCOUNTER — Other Ambulatory Visit: Payer: Self-pay

## 2021-03-21 ENCOUNTER — Encounter: Payer: Self-pay | Admitting: Family Medicine

## 2021-03-21 MED ORDER — BUPROPION HCL 75 MG PO TABS: 75.0000 mg | ORAL_TABLET | Freq: Two times a day (BID) | ORAL | 0 refills | Status: DC

## 2021-04-16 ENCOUNTER — Encounter: Payer: Self-pay | Admitting: *Deleted

## 2021-05-01 ENCOUNTER — Other Ambulatory Visit: Payer: Self-pay | Admitting: Family Medicine

## 2021-05-01 DIAGNOSIS — F32A Depression, unspecified: Secondary | ICD-10-CM

## 2021-05-08 ENCOUNTER — Encounter: Payer: BC Managed Care – PPO | Admitting: Family Medicine

## 2021-05-21 ENCOUNTER — Other Ambulatory Visit: Payer: Self-pay | Admitting: Family Medicine

## 2021-05-21 DIAGNOSIS — F419 Anxiety disorder, unspecified: Secondary | ICD-10-CM

## 2021-05-21 DIAGNOSIS — F32A Depression, unspecified: Secondary | ICD-10-CM

## 2021-07-25 ENCOUNTER — Other Ambulatory Visit: Payer: Self-pay | Admitting: Family Medicine

## 2021-07-25 DIAGNOSIS — F32A Depression, unspecified: Secondary | ICD-10-CM

## 2021-07-25 DIAGNOSIS — F419 Anxiety disorder, unspecified: Secondary | ICD-10-CM

## 2021-07-31 ENCOUNTER — Encounter: Payer: Self-pay | Admitting: Family Medicine

## 2021-07-31 ENCOUNTER — Other Ambulatory Visit: Payer: Self-pay

## 2021-07-31 ENCOUNTER — Ambulatory Visit (INDEPENDENT_AMBULATORY_CARE_PROVIDER_SITE_OTHER): Payer: BC Managed Care – PPO | Admitting: Family Medicine

## 2021-07-31 VITALS — BP 130/88 | HR 85 | Temp 97.9°F | Resp 16 | Ht 67.5 in | Wt 200.8 lb

## 2021-07-31 DIAGNOSIS — Z114 Encounter for screening for human immunodeficiency virus [HIV]: Secondary | ICD-10-CM

## 2021-07-31 DIAGNOSIS — E785 Hyperlipidemia, unspecified: Secondary | ICD-10-CM | POA: Diagnosis not present

## 2021-07-31 DIAGNOSIS — R011 Cardiac murmur, unspecified: Secondary | ICD-10-CM | POA: Insufficient documentation

## 2021-07-31 DIAGNOSIS — Z23 Encounter for immunization: Secondary | ICD-10-CM | POA: Diagnosis not present

## 2021-07-31 DIAGNOSIS — Z1159 Encounter for screening for other viral diseases: Secondary | ICD-10-CM

## 2021-07-31 DIAGNOSIS — Z0000 Encounter for general adult medical examination without abnormal findings: Secondary | ICD-10-CM

## 2021-07-31 DIAGNOSIS — E039 Hypothyroidism, unspecified: Secondary | ICD-10-CM | POA: Diagnosis not present

## 2021-07-31 LAB — CBC WITH DIFFERENTIAL/PLATELET
Basophils Absolute: 0 10*3/uL (ref 0.0–0.1)
Basophils Relative: 0.5 % (ref 0.0–3.0)
Eosinophils Absolute: 0.1 10*3/uL (ref 0.0–0.7)
Eosinophils Relative: 2 % (ref 0.0–5.0)
HCT: 40.2 % (ref 36.0–46.0)
Hemoglobin: 13.9 g/dL (ref 12.0–15.0)
Lymphocytes Relative: 25.8 % (ref 12.0–46.0)
Lymphs Abs: 1.8 10*3/uL (ref 0.7–4.0)
MCHC: 34.5 g/dL (ref 30.0–36.0)
MCV: 86.8 fl (ref 78.0–100.0)
Monocytes Absolute: 0.5 10*3/uL (ref 0.1–1.0)
Monocytes Relative: 6.7 % (ref 3.0–12.0)
Neutro Abs: 4.5 10*3/uL (ref 1.4–7.7)
Neutrophils Relative %: 65 % (ref 43.0–77.0)
Platelets: 306 10*3/uL (ref 150.0–400.0)
RBC: 4.63 Mil/uL (ref 3.87–5.11)
RDW: 12.8 % (ref 11.5–15.5)
WBC: 6.9 10*3/uL (ref 4.0–10.5)

## 2021-07-31 LAB — BASIC METABOLIC PANEL
BUN: 18 mg/dL (ref 6–23)
CO2: 29 mEq/L (ref 19–32)
Calcium: 9.8 mg/dL (ref 8.4–10.5)
Chloride: 105 mEq/L (ref 96–112)
Creatinine, Ser: 0.9 mg/dL (ref 0.40–1.20)
GFR: 72.06 mL/min (ref >60.00)
Glucose, Bld: 80 mg/dL (ref 70–99)
Potassium: 4 mEq/L (ref 3.5–5.1)
Sodium: 142 mEq/L (ref 135–145)

## 2021-07-31 LAB — HEPATIC FUNCTION PANEL
ALT: 25 U/L (ref 0–35)
AST: 24 U/L (ref 0–37)
Albumin: 4.6 g/dL (ref 3.5–5.2)
Alkaline Phosphatase: 86 U/L (ref 39–117)
Bilirubin, Direct: 0.1 mg/dL (ref 0.0–0.3)
Total Bilirubin: 0.6 mg/dL (ref 0.2–1.2)
Total Protein: 7 g/dL (ref 6.0–8.3)

## 2021-07-31 LAB — TSH: TSH: 1.7 u[IU]/mL (ref 0.35–5.50)

## 2021-07-31 LAB — LIPID PANEL
Cholesterol: 157 mg/dL (ref 0–200)
HDL: 48.1 mg/dL (ref >39.00)
NonHDL: 109.11
Total CHOL/HDL Ratio: 3
Triglycerides: 248 mg/dL — ABNORMAL HIGH (ref 0.0–149.0)
VLDL: 49.6 mg/dL — ABNORMAL HIGH (ref 0.0–40.0)

## 2021-07-31 LAB — LDL CHOLESTEROL, DIRECT: Direct LDL: 85 mg/dL

## 2021-07-31 NOTE — Assessment & Plan Note (Signed)
Pt's PE WNL w/ exception of obesity.  UTD on colonoscopy, pap.  Plans to schedule mammo.  Flu shot given.  Check labs.  Anticipatory guidance provided.

## 2021-07-31 NOTE — Assessment & Plan Note (Signed)
Chronic problem.  Tolerating statin w/o difficulty.  Check labs.  Adjust meds prn  

## 2021-07-31 NOTE — Patient Instructions (Addendum)
Follow up in 6 months to recheck cholesterol and weight loss We'll notify you of your lab results and make any changes if needed Call and schedule your mammogram! We'll call you to schedule your ECHO (heart ultrasound) for the murmur Continue to work on healthy diet and regular exercise- you can do it!! Call with any questions or concerns Stay Safe!  Stay Healthy! Happy Fall!!!

## 2021-07-31 NOTE — Assessment & Plan Note (Signed)
Check labs.  Adjust meds prn  

## 2021-07-31 NOTE — Assessment & Plan Note (Signed)
New.  II/VI SEM at [facility].  She is asymptomatic but will get ECHO to assess.

## 2021-07-31 NOTE — Progress Notes (Signed)
   Subjective:    Patient ID: Dana Zavala, female    DOB: June 17, 1966, 55 y.o.   MRN: [id number]  HPI CPE- UTD on colonoscopy, pap.  Due for mammo- pt to schedule  Health Maintenance  Topic Date Due   HIV Screening  Never done   Hepatitis C Screening  Never done   Zoster Vaccines- Shingrix (1 of 2) Never done   INFLUENZA VACCINE  05/19/2021   COVID-19 Vaccine (3 - Booster for Pfizer series) 08/16/2021 (Originally 09/09/2020)   MAMMOGRAM  07/31/2022 (Originally 07/24/2021)   PAP SMEAR-Modifier  05/07/2023   COLONOSCOPY (Pts 45-57yrs Insurance coverage will need to be confirmed)  05/21/2026   TETANUS/TDAP  04/12/2028   HPV VACCINES  Aged Out      Review of Systems Patient reports no vision/ hearing changes, adenopathy,fever, weight change,  persistant/recurrent hoarseness , swallowing issues, chest pain, palpitations, edema, persistant/recurrent cough, hemoptysis, dyspnea (rest/exertional/paroxysmal nocturnal), gastrointestinal bleeding (melena, rectal bleeding), abdominal pain, significant heartburn, bowel changes, GU symptoms (dysuria, hematuria, incontinence), Gyn symptoms (abnormal  bleeding, pain),  syncope, focal weakness, memory loss, numbness & tingling, skin/hair/nail changes, abnormal bruising or bleeding, anxiety, or depression.   This visit occurred during the SARS-CoV-2 public health emergency.  Safety protocols were in place, including screening questions prior to the visit, additional usage of staff PPE, and extensive cleaning of exam room while observing appropriate contact time as indicated for disinfecting solutions.      Objective:   Physical Exam General Appearance:    Alert, cooperative, no distress, appears stated age  Head:    Normocephalic, without obvious abnormality, atraumatic  Eyes:    PERRL, conjunctiva/corneas clear, EOM's intact, fundi    benign, both eyes  Ears:    Normal TM's and external ear canals, both ears  Nose:   Deferred due to COVID   Throat:   Neck:   Supple, symmetrical, trachea midline, no adenopathy;    Thyroid: no enlargement/tenderness/nodules  Back:     Symmetric, no curvature, ROM normal, no CVA tenderness  Lungs:     Clear to auscultation bilaterally, respirations unlabored  Chest Wall:    No tenderness or deformity   Heart:    Regular rate and rhythm, S1 and S2 normal, + II/VI SEM at RUSB  Breast Exam:    Deferred to mammo  Abdomen:     Soft, non-tender, bowel sounds active all four quadrants,    no masses, no organomegaly  Genitalia:    Deferred  Rectal:    Extremities:   Extremities normal, atraumatic, no cyanosis or edema  Pulses:   2+ and symmetric all extremities  Skin:   Skin color, texture, turgor normal, no rashes or lesions  Lymph nodes:   Cervical, supraclavicular, and axillary nodes normal  Neurologic:   CNII-XII intact, normal strength, sensation and reflexes    throughout          Assessment & Plan:

## 2021-08-01 ENCOUNTER — Telehealth: Payer: Self-pay

## 2021-08-01 LAB — HEPATITIS C ANTIBODY
Hepatitis C Ab: NONREACTIVE
SIGNAL TO CUT-OFF: 0.04 (ref <1.00)

## 2021-08-01 LAB — HIV ANTIBODY (ROUTINE TESTING W REFLEX): HIV 1&2 Ab, 4th Generation: NONREACTIVE

## 2021-08-01 NOTE — Telephone Encounter (Signed)
Called patient back about labs. Left vm to return call back.

## 2021-08-01 NOTE — Telephone Encounter (Signed)
Caller name:Kushi Hosie Poisson   On DPR? :No  Call back number:[phone]  Provider they see: Beverely Low   Reason for call:Calling Sabrina back about labs

## 2021-08-11 ENCOUNTER — Encounter: Payer: Self-pay | Admitting: Family Medicine

## 2021-08-11 ENCOUNTER — Other Ambulatory Visit: Payer: Self-pay | Admitting: Family Medicine

## 2021-08-11 DIAGNOSIS — Z1231 Encounter for screening mammogram for malignant neoplasm of breast: Secondary | ICD-10-CM

## 2021-08-12 ENCOUNTER — Telehealth (HOSPITAL_COMMUNITY): Payer: Self-pay | Admitting: Family Medicine

## 2021-08-12 NOTE — Telephone Encounter (Signed)
Patient declined to schedule echocardiogram due to our facility is not In Network with her insurance. Order will be removed from the echo WQ and patient will call ordering provider for future direction. Thank you.

## 2021-09-16 ENCOUNTER — Other Ambulatory Visit: Payer: Self-pay

## 2021-09-16 ENCOUNTER — Ambulatory Visit
Admission: RE | Admit: 2021-09-16 | Discharge: 2021-09-16 | Disposition: A | Discharge status: Home / Self Care | Payer: BC Managed Care – PPO | Source: Ambulatory Visit | Attending: Family Medicine | Admitting: Family Medicine

## 2021-09-16 DIAGNOSIS — Z1231 Encounter for screening mammogram for malignant neoplasm of breast: Secondary | ICD-10-CM

## 2021-10-22 ENCOUNTER — Other Ambulatory Visit: Payer: Self-pay | Admitting: Family Medicine

## 2021-10-22 DIAGNOSIS — F32A Depression, unspecified: Secondary | ICD-10-CM

## 2021-11-15 ENCOUNTER — Other Ambulatory Visit: Payer: Self-pay | Admitting: Family Medicine

## 2021-11-28 ENCOUNTER — Other Ambulatory Visit: Payer: Self-pay | Admitting: Family Medicine

## 2021-12-20 ENCOUNTER — Other Ambulatory Visit: Payer: Self-pay | Admitting: Family Medicine

## 2022-01-22 ENCOUNTER — Other Ambulatory Visit: Payer: Self-pay | Admitting: Family Medicine

## 2022-01-22 DIAGNOSIS — F32A Depression, unspecified: Secondary | ICD-10-CM

## 2022-01-26 ENCOUNTER — Other Ambulatory Visit: Payer: Self-pay | Admitting: Family Medicine

## 2022-01-26 DIAGNOSIS — F32A Depression, unspecified: Secondary | ICD-10-CM

## 2022-01-30 ENCOUNTER — Ambulatory Visit: Payer: BC Managed Care – PPO | Admitting: Family Medicine

## 2022-02-01 ENCOUNTER — Other Ambulatory Visit: Payer: Self-pay | Admitting: Family Medicine

## 2022-02-01 DIAGNOSIS — F419 Anxiety disorder, unspecified: Secondary | ICD-10-CM

## 2022-03-12 ENCOUNTER — Encounter: Payer: Self-pay | Admitting: Family Medicine

## 2022-03-13 MED ORDER — TRIAMCINOLONE ACETONIDE 0.1 % EX CREA: TOPICAL_CREAM | CUTANEOUS | 1 refills | Status: AC

## 2022-04-23 ENCOUNTER — Other Ambulatory Visit: Payer: Self-pay | Admitting: Family Medicine

## 2022-05-17 ENCOUNTER — Other Ambulatory Visit: Payer: Self-pay | Admitting: Family Medicine

## 2022-05-21 ENCOUNTER — Other Ambulatory Visit: Payer: Self-pay | Admitting: Family Medicine

## 2022-05-21 DIAGNOSIS — F419 Anxiety disorder, unspecified: Secondary | ICD-10-CM

## 2022-07-08 ENCOUNTER — Telehealth: Payer: BC Managed Care – PPO | Admitting: Physician Assistant

## 2022-07-08 DIAGNOSIS — M791 Myalgia, unspecified site: Secondary | ICD-10-CM

## 2022-07-08 DIAGNOSIS — R509 Fever, unspecified: Secondary | ICD-10-CM

## 2022-07-08 DIAGNOSIS — R21 Rash and other nonspecific skin eruption: Secondary | ICD-10-CM

## 2022-07-08 DIAGNOSIS — M542 Cervicalgia: Secondary | ICD-10-CM

## 2022-07-08 NOTE — Progress Notes (Signed)
Because of having multiple complaints with severe neck pain, rash, weakness in right arm, and fevers, I feel your condition warrants further evaluation and I recommend that you be seen in a face to face visit. It may be best for them to see you to rule out meningitis versus viral exanthem.    NOTE: There will be NO CHARGE for this eVisit   If you are having a true medical emergency please call 911.      For an urgent face to face visit, [place] has seven urgent care centers for your convenience:     [facility] [place] [facility] [place] Get Driving Directions [phone] [address] [place], [place] [address]    [facility]) Get Driving Directions [phone] [place], [place] [address]  [facility] [place] [facility]) Get Driving Directions [phone] [address] [place],  [place]  [address]  [facility] [place] [facility] [address] Get Driving Directions  [phone] [place],  [place] [address]   [facility] Get Driving Directions [phone] [address] [place], [place], [place] [address]   [facility] Get Driving Directions  [phone] [address] [place], [place] [address]   [facility] [place] Get Driving Directions [phone] [address], [place],  [address]  Your MyChart E-visit questionnaire answers were reviewed by a board certified advanced clinical practitioner to complete your personal care plan based on your specific symptoms.  Thank you for using e-Visits.   I provided 5 minutes of non face-to-face time during this encounter for chart review and documentation.

## 2022-07-09 ENCOUNTER — Encounter: Payer: Self-pay | Admitting: Family Medicine

## 2022-07-09 ENCOUNTER — Ambulatory Visit: Payer: BC Managed Care – PPO | Admitting: Family Medicine

## 2022-07-09 VITALS — BP 138/70 | HR 86 | Temp 98.9°F | Resp 16 | Ht 67.5 in | Wt 198.0 lb

## 2022-07-09 DIAGNOSIS — M79621 Pain in right upper arm: Secondary | ICD-10-CM | POA: Diagnosis not present

## 2022-07-09 DIAGNOSIS — M542 Cervicalgia: Secondary | ICD-10-CM | POA: Diagnosis not present

## 2022-07-09 DIAGNOSIS — R21 Rash and other nonspecific skin eruption: Secondary | ICD-10-CM | POA: Diagnosis not present

## 2022-07-09 MED ORDER — METHOCARBAMOL 500 MG PO TABS: 500.0000 mg | ORAL_TABLET | Freq: Three times a day (TID) | ORAL | 0 refills | Status: DC | PRN

## 2022-07-09 MED ORDER — PREDNISONE 10 MG PO TABS: ORAL_TABLET | ORAL | 0 refills | Status: DC

## 2022-07-09 NOTE — Patient Instructions (Signed)
Follow up by phone or MyChart next week and let me know how things are going We'll notify you of your lab results and make any changes if needed START the Prednisone as directed- take w/ food USE the Methocarbamol as needed for pain and spasm AVOID Motrin, Ibuprofen, Aleve while on the Prednisone but you can take Tylenol as needed for pain If the rash is changing or worsening- please let me know! If the arm weakness is worsening- please go to the ER for evaluation Call with any questions or concerns Hang in there!!!

## 2022-07-09 NOTE — Progress Notes (Signed)
   Subjective:    Patient ID: Dana Zavala, female    DOB: 07-07-1966, 56 y.o.   MRN: [id number]  HPI Rash- pt did eVisit yesterday.  Pt reports rash appeared Tuesday starting in R armpit and extending down trunk.  A few lesions on arms.  Has not been out side recently.  Not itchy.  Not painful.  No contact w/ pets or sleeping in new environment.    Neck pain- sxs started ~10 days ago.  'it is horrible at night' and unable to find a comfortable position.  Pain will shoot up the back of her head and throb.  No known injury.    R upper arm pain- pt reports muscle is very weak and sore.  Sxs correlated w/ appearance of rash.  Pt has done tylenol, heating pad, lidocaine, salon pas w/o relief.  Had difficult time putting hair in ponytail this morning.  R handed.  No known tick bites.  No fever.   Review of Systems For ROS see HPI     Objective:   Physical Exam Vitals reviewed.  Constitutional:      General: She is not in acute distress.    Appearance: Normal appearance. She is not ill-appearing.  HENT:     Head: Normocephalic and atraumatic.     Mouth/Throat:     Mouth: Mucous membranes are moist.     Pharynx: Oropharynx is clear.  Eyes:     Extraocular Movements: Extraocular movements intact.     Conjunctiva/sclera: Conjunctivae normal.     Pupils: Pupils are equal, round, and reactive to light.  Cardiovascular:     Rate and Rhythm: Normal rate and regular rhythm.  Musculoskeletal:        General: Tenderness (TTP over R deltoid) present. No swelling or deformity.     Cervical back: Normal range of motion. Tenderness (TTP over trap spasms bilaterally, R>L) present. No rigidity.  Skin:    General: Skin is warm and dry.     Findings: Rash (erythematous maculopapular rash spread across trunk and scattered on upper extremities.  no rash on palms or soles) present.  Neurological:     General: No focal deficit present.     Mental Status: She is alert and oriented to person, place,  and time.  Psychiatric:        Mood and Affect: Mood normal.        Behavior: Behavior normal.        Thought Content: Thought content normal.           Assessment & Plan:   Neck pain- new.  Pt w/ extremely tight traps bilaterally but R is worse than L.  Start Prednisone taper and Methocarbamol to improve pain/spasm.  Encouraged heat and massage.  Will follow.  Rash- new.  Nonspecific.  Not consistent w/ RMSF and pt w/o other sxs.  Denies new exposures or change in environmental conditions.  Check CBC and ANA.  Start Prednisone taper to treat.  Pt expressed understanding and is in agreement w/ plan.   Upper arm pain- new.  Pt is point tender over R deltoid.  No known injury or change in activity level.  Pt has extremely tight R trap so this may be the cause of her pain and weakness.  Again, Prednisone taper and Methocarbamol.  Pt expressed understanding and is in agreement w/ plan.

## 2022-07-10 LAB — CBC WITH DIFFERENTIAL/PLATELET
Basophils Absolute: 0.1 10*3/uL (ref 0.0–0.1)
Basophils Relative: 1 % (ref 0.0–3.0)
Eosinophils Absolute: 0.2 10*3/uL (ref 0.0–0.7)
Eosinophils Relative: 2.9 % (ref 0.0–5.0)
HCT: 40.2 % (ref 36.0–46.0)
Hemoglobin: 13.6 g/dL (ref 12.0–15.0)
Lymphocytes Relative: 27.1 % (ref 12.0–46.0)
Lymphs Abs: 2.1 10*3/uL (ref 0.7–4.0)
MCHC: 34 g/dL (ref 30.0–36.0)
MCV: 88.3 fl (ref 78.0–100.0)
Monocytes Absolute: 0.6 10*3/uL (ref 0.1–1.0)
Monocytes Relative: 7.3 % (ref 3.0–12.0)
Neutro Abs: 4.7 10*3/uL (ref 1.4–7.7)
Neutrophils Relative %: 61.7 % (ref 43.0–77.0)
Platelets: 330 10*3/uL (ref 150.0–400.0)
RBC: 4.55 Mil/uL (ref 3.87–5.11)
RDW: 13.1 % (ref 11.5–15.5)
WBC: 7.6 10*3/uL (ref 4.0–10.5)

## 2022-07-10 LAB — ANA: Anti Nuclear Antibody (ANA): NEGATIVE

## 2022-07-10 NOTE — Progress Notes (Signed)
Left pt a VM stating lab results  

## 2022-07-13 NOTE — Progress Notes (Signed)
Left pt a VM stating lab results  

## 2022-08-05 ENCOUNTER — Encounter: Payer: Self-pay | Admitting: Family Medicine

## 2022-08-05 ENCOUNTER — Ambulatory Visit (INDEPENDENT_AMBULATORY_CARE_PROVIDER_SITE_OTHER): Payer: BC Managed Care – PPO | Admitting: Family Medicine

## 2022-08-05 VITALS — BP 140/82 | HR 77 | Temp 97.7°F | Ht 67.5 in | Wt 200.8 lb

## 2022-08-05 DIAGNOSIS — E785 Hyperlipidemia, unspecified: Secondary | ICD-10-CM | POA: Diagnosis not present

## 2022-08-05 DIAGNOSIS — Z0000 Encounter for general adult medical examination without abnormal findings: Secondary | ICD-10-CM | POA: Diagnosis not present

## 2022-08-05 DIAGNOSIS — Z23 Encounter for immunization: Secondary | ICD-10-CM | POA: Diagnosis not present

## 2022-08-05 NOTE — Patient Instructions (Addendum)
Follow up in 6 months to recheck cholesterol and get 2nd shingles shot We'll notify you of your lab results and make any changes if needed Continue to work on healthy diet and regular exercise- you can do it! Consider a massage gun to help w/ muscle spasms For joint health- Vit D, Fish Oil, Turmeric, and Glucosamine Chondroitin Call with any questions or concerns Stay Safe!  Stay Healthy! Happy Fall!!!

## 2022-08-05 NOTE — Assessment & Plan Note (Addendum)
Pt's PE WNL w/ exception of BMI.  UTD on mammo, pap, Tdap, colonoscopy.  Flu shot and first Shingrix given.  Check labs.  Anticipatory guidance provided.

## 2022-08-05 NOTE — Progress Notes (Signed)
   Subjective:    Patient ID: Dana Zavala, female    DOB: 03-13-1966, 56 y.o.   MRN: [id number]  HPI CPE-UTD on mammo, pap, colonoscopy, Tdap.  Will get flu today  Health Maintenance  Topic Date Due   Zoster Vaccines- Shingrix (1 of 2) Never done   INFLUENZA VACCINE  05/19/2022   COVID-19 Vaccine (3 - Pfizer series) 08/21/2022 (Originally 06/04/2020)   MAMMOGRAM  09/16/2022   PAP SMEAR-Modifier  05/07/2023   COLONOSCOPY (Pts 45-26yrs Insurance coverage will need to be confirmed)  05/21/2026   TETANUS/TDAP  04/12/2028   Hepatitis C Screening  Completed   HIV Screening  Completed   HPV VACCINES  Aged Out      Review of Systems Patient reports no vision/ hearing changes, adenopathy,fever, weight change,  persistant/recurrent hoarseness , swallowing issues, chest pain, palpitations, edema, persistant/recurrent cough, hemoptysis, dyspnea (rest/exertional/paroxysmal nocturnal), gastrointestinal bleeding (melena, rectal bleeding), abdominal pain, significant heartburn, bowel changes, GU symptoms (dysuria, hematuria, incontinence), Gyn symptoms (abnormal  bleeding, pain),  syncope, focal weakness, memory loss, numbness & tingling, skin/hair/nail changes, abnormal bruising or bleeding, anxiety, or depression.     Objective:   Physical Exam General Appearance:    Alert, cooperative, no distress, appears stated age  Head:    Normocephalic, without obvious abnormality, atraumatic  Eyes:    PERRL, conjunctiva/corneas clear, EOM's intact both eyes  Ears:    Normal TM's and external ear canals, both ears  Nose:   Nares normal, septum midline, mucosa normal, no drainage    or sinus tenderness  Throat:   Lips, mucosa, and tongue normal; teeth and gums normal  Neck:   Supple, symmetrical, trachea midline, no adenopathy;    Thyroid: no enlargement/tenderness/nodules  Back:     Symmetric, no curvature, ROM normal, no CVA tenderness  Lungs:     Clear to auscultation bilaterally, respirations  unlabored  Chest Wall:    No tenderness or deformity   Heart:    Regular rate and rhythm, S1 and S2 normal, no murmur, rub   or gallop  Breast Exam:    Deferred to GYN  Abdomen:     Soft, non-tender, bowel sounds active all four quadrants,    no masses, no organomegaly  Genitalia:    Deferred to GYN  Rectal:    Extremities:   Extremities normal, atraumatic, no cyanosis or edema  Pulses:   2+ and symmetric all extremities  Skin:   Skin color, texture, turgor normal, no rashes or lesions  Lymph nodes:   Cervical, supraclavicular, and axillary nodes normal  Neurologic:   CNII-XII intact, normal strength, sensation and reflexes    throughout          Assessment & Plan:

## 2022-08-05 NOTE — Assessment & Plan Note (Signed)
Chronic problem.  Check labs.  Adjust meds prn  

## 2022-08-06 LAB — CBC WITH DIFFERENTIAL/PLATELET
Basophils Absolute: 0.1 10*3/uL (ref 0.0–0.1)
Basophils Relative: 0.8 % (ref 0.0–3.0)
Eosinophils Absolute: 0.3 10*3/uL (ref 0.0–0.7)
Eosinophils Relative: 3.3 % (ref 0.0–5.0)
HCT: 40.4 % (ref 36.0–46.0)
Hemoglobin: 14 g/dL (ref 12.0–15.0)
Lymphocytes Relative: 24.9 % (ref 12.0–46.0)
Lymphs Abs: 2 10*3/uL (ref 0.7–4.0)
MCHC: 34.6 g/dL (ref 30.0–36.0)
MCV: 88 fl (ref 78.0–100.0)
Monocytes Absolute: 0.5 10*3/uL (ref 0.1–1.0)
Monocytes Relative: 6.7 % (ref 3.0–12.0)
Neutro Abs: 5.1 10*3/uL (ref 1.4–7.7)
Neutrophils Relative %: 64.3 % (ref 43.0–77.0)
Platelets: 303 10*3/uL (ref 150.0–400.0)
RBC: 4.59 Mil/uL (ref 3.87–5.11)
RDW: 13.5 % (ref 11.5–15.5)
WBC: 7.9 10*3/uL (ref 4.0–10.5)

## 2022-08-06 LAB — HEPATIC FUNCTION PANEL
ALT: 27 U/L (ref 0–35)
AST: 24 U/L (ref 0–37)
Albumin: 4.9 g/dL (ref 3.5–5.2)
Alkaline Phosphatase: 92 U/L (ref 39–117)
Bilirubin, Direct: 0.1 mg/dL (ref 0.0–0.3)
Total Bilirubin: 0.9 mg/dL (ref 0.2–1.2)
Total Protein: 7.5 g/dL (ref 6.0–8.3)

## 2022-08-06 LAB — BASIC METABOLIC PANEL
BUN: 14 mg/dL (ref 6–23)
CO2: 28 mEq/L (ref 19–32)
Calcium: 10.4 mg/dL (ref 8.4–10.5)
Chloride: 104 mEq/L (ref 96–112)
Creatinine, Ser: 1.02 mg/dL (ref 0.40–1.20)
GFR: 61.57 mL/min (ref >60.00)
Glucose, Bld: 96 mg/dL (ref 70–99)
Potassium: 4.9 mEq/L (ref 3.5–5.1)
Sodium: 139 mEq/L (ref 135–145)

## 2022-08-06 LAB — LIPID PANEL
Cholesterol: 159 mg/dL (ref 0–200)
HDL: 55.2 mg/dL (ref >39.00)
LDL Cholesterol: 77 mg/dL (ref 0–99)
NonHDL: 103.36
Total CHOL/HDL Ratio: 3
Triglycerides: 130 mg/dL (ref 0.0–149.0)
VLDL: 26 mg/dL (ref 0.0–40.0)

## 2022-08-06 LAB — TSH: TSH: 1.5 u[IU]/mL (ref 0.35–5.50)

## 2022-08-06 NOTE — Progress Notes (Signed)
Left pt vm stating lab results  

## 2022-08-10 ENCOUNTER — Other Ambulatory Visit: Payer: Self-pay | Admitting: Family Medicine

## 2022-08-10 DIAGNOSIS — Z1231 Encounter for screening mammogram for malignant neoplasm of breast: Secondary | ICD-10-CM

## 2022-08-19 ENCOUNTER — Other Ambulatory Visit: Payer: Self-pay

## 2022-08-19 DIAGNOSIS — F419 Anxiety disorder, unspecified: Secondary | ICD-10-CM

## 2022-08-19 MED ORDER — VENLAFAXINE HCL ER 150 MG PO CP24: 150.0000 mg | ORAL_CAPSULE | Freq: Every day | ORAL | 0 refills | Status: DC

## 2022-08-24 ENCOUNTER — Other Ambulatory Visit: Payer: Self-pay | Admitting: Family Medicine

## 2022-09-19 ENCOUNTER — Other Ambulatory Visit: Payer: Self-pay | Admitting: Family Medicine

## 2022-10-01 ENCOUNTER — Ambulatory Visit: Payer: BC Managed Care – PPO

## 2022-10-10 ENCOUNTER — Other Ambulatory Visit: Payer: Self-pay | Admitting: Family Medicine

## 2022-11-10 ENCOUNTER — Other Ambulatory Visit: Payer: Self-pay | Admitting: Family Medicine

## 2022-11-10 DIAGNOSIS — F32A Depression, unspecified: Secondary | ICD-10-CM

## 2022-11-13 ENCOUNTER — Other Ambulatory Visit: Payer: Self-pay | Admitting: Family Medicine

## 2022-12-17 ENCOUNTER — Ambulatory Visit
Admission: RE | Admit: 2022-12-17 | Discharge: 2022-12-17 | Disposition: A | Discharge status: Home / Self Care | Payer: BC Managed Care – PPO | Source: Ambulatory Visit | Attending: Family Medicine | Admitting: Family Medicine

## 2022-12-17 DIAGNOSIS — Z1231 Encounter for screening mammogram for malignant neoplasm of breast: Secondary | ICD-10-CM

## 2023-01-07 ENCOUNTER — Other Ambulatory Visit: Payer: Self-pay | Admitting: Family Medicine

## 2023-01-26 ENCOUNTER — Other Ambulatory Visit: Payer: Self-pay | Admitting: Family Medicine

## 2023-01-26 DIAGNOSIS — F419 Anxiety disorder, unspecified: Secondary | ICD-10-CM

## 2023-02-04 ENCOUNTER — Ambulatory Visit: Payer: BC Managed Care – PPO | Admitting: Family Medicine

## 2023-02-04 ENCOUNTER — Encounter: Payer: Self-pay | Admitting: Family Medicine

## 2023-02-04 VITALS — BP 138/96 | HR 72 | Temp 97.8°F | Resp 17 | Ht 67.5 in | Wt 196.0 lb

## 2023-02-04 DIAGNOSIS — E785 Hyperlipidemia, unspecified: Secondary | ICD-10-CM

## 2023-02-04 DIAGNOSIS — E669 Obesity, unspecified: Secondary | ICD-10-CM | POA: Diagnosis not present

## 2023-02-04 DIAGNOSIS — Z23 Encounter for immunization: Secondary | ICD-10-CM | POA: Diagnosis not present

## 2023-02-04 DIAGNOSIS — R030 Elevated blood-pressure reading, without diagnosis of hypertension: Secondary | ICD-10-CM | POA: Diagnosis not present

## 2023-02-04 NOTE — Progress Notes (Signed)
   Subjective:    Patient ID: Dana Zavala, female    DOB: [birth year], 58 y.o.   MRN: [id number]  HPI Hyperlipidemia- chronic problem, on Lipitor  daily.  Denies CP, SOB, abd pain, N/V.  Obesity- pt is down 5 lbs since last visit.  Pt reports she has been working on weight loss- has added exercise and decreased portion sizes.  Has eliminated breads and most processed foods.  Elevated BP- pt reports she had a very difficult day at work.  Had to rush to get here.   Review of Systems For ROS see HPI     Objective:   Physical Exam Vitals reviewed.  Constitutional:      General: She is not in acute distress.    Appearance: Normal appearance. She is well-developed. She is not ill-appearing.  HENT:     Head: Normocephalic and atraumatic.  Eyes:     Conjunctiva/sclera: Conjunctivae normal.     Pupils: Pupils are equal, round, and reactive to light.  Neck:     Thyroid: No thyromegaly.  Cardiovascular:     Rate and Rhythm: Normal rate and regular rhythm.     Pulses: Normal pulses.     Heart sounds: Normal heart sounds. No murmur heard. Pulmonary:     Effort: Pulmonary effort is normal. No respiratory distress.     Breath sounds: Normal breath sounds.  Abdominal:     General: There is no distension.     Palpations: Abdomen is soft.     Tenderness: There is no abdominal tenderness.  Musculoskeletal:     Cervical back: Normal range of motion and neck supple.     Right lower leg: No edema.     Left lower leg: No edema.  Lymphadenopathy:     Cervical: No cervical adenopathy.  Skin:    General: Skin is warm and dry.  Neurological:     General: No focal deficit present.     Mental Status: She is alert and oriented to person, place, and time.  Psychiatric:        Mood and Affect: Mood normal.        Behavior: Behavior normal.        Thought Content: Thought content normal.           Assessment & Plan:

## 2023-02-04 NOTE — Assessment & Plan Note (Signed)
Pt is down 5 lbs since last visit.  She is using her son's upcoming October wedding as motivation.  Applauded her efforts.  Will continue to follow.

## 2023-02-04 NOTE — Assessment & Plan Note (Signed)
New.  Pt reports a very stressful day at work and then having to rush here.  Denies CP, SOB, HA's, visual changes, edema.  No hx of HTN.  Will continue to monitor closely going forward.

## 2023-02-04 NOTE — Patient Instructions (Signed)
Schedule your complete physical in 6 months We'll notify you of your lab results and make any changes if needed Keep up the good work on healthy diet and regular exercise- you're doing great!!! For joint pain- turmeric, fish oil, Vit D, glucosamine/chondroitin Call with any questions or concerns Happy Spring!!!

## 2023-02-04 NOTE — Assessment & Plan Note (Signed)
Chronic problem.  Tolerating Lipitor 20mg daily w/o difficulty.  Check labs.  Adjust meds prn  

## 2023-02-05 ENCOUNTER — Telehealth: Payer: Self-pay

## 2023-02-05 LAB — LIPID PANEL
Cholesterol: 139 mg/dL (ref 0–200)
HDL: 40.7 mg/dL (ref >39.00)
LDL Cholesterol: 63 mg/dL (ref 0–99)
NonHDL: 97.95
Total CHOL/HDL Ratio: 3
Triglycerides: 176 mg/dL — ABNORMAL HIGH (ref 0.0–149.0)
VLDL: 35.2 mg/dL (ref 0.0–40.0)

## 2023-02-05 LAB — HEPATIC FUNCTION PANEL
ALT: 24 U/L (ref 0–35)
AST: 27 U/L (ref 0–37)
Albumin: 4.6 g/dL (ref 3.5–5.2)
Alkaline Phosphatase: 83 U/L (ref 39–117)
Bilirubin, Direct: 0.1 mg/dL (ref 0.0–0.3)
Total Bilirubin: 0.5 mg/dL (ref 0.2–1.2)
Total Protein: 7 g/dL (ref 6.0–8.3)

## 2023-02-05 LAB — BASIC METABOLIC PANEL
BUN: 15 mg/dL (ref 6–23)
CO2: 27 mEq/L (ref 19–32)
Calcium: 9.9 mg/dL (ref 8.4–10.5)
Chloride: 104 mEq/L (ref 96–112)
Creatinine, Ser: 1.08 mg/dL (ref 0.40–1.20)
GFR: 57.29 mL/min — ABNORMAL LOW (ref >60.00)
Glucose, Bld: 91 mg/dL (ref 70–99)
Potassium: 3.8 mEq/L (ref 3.5–5.1)
Sodium: 140 mEq/L (ref 135–145)

## 2023-02-05 LAB — CBC WITH DIFFERENTIAL/PLATELET
Basophils Absolute: 0.1 10*3/uL (ref 0.0–0.1)
Basophils Relative: 1.2 % (ref 0.0–3.0)
Eosinophils Absolute: 0.2 10*3/uL (ref 0.0–0.7)
Eosinophils Relative: 2.7 % (ref 0.0–5.0)
HCT: 38.5 % (ref 36.0–46.0)
Hemoglobin: 13.4 g/dL (ref 12.0–15.0)
Lymphocytes Relative: 31.4 % (ref 12.0–46.0)
Lymphs Abs: 1.8 10*3/uL (ref 0.7–4.0)
MCHC: 34.9 g/dL (ref 30.0–36.0)
MCV: 88 fl (ref 78.0–100.0)
Monocytes Absolute: 0.5 10*3/uL (ref 0.1–1.0)
Monocytes Relative: 9 % (ref 3.0–12.0)
Neutro Abs: 3.2 10*3/uL (ref 1.4–7.7)
Neutrophils Relative %: 55.7 % (ref 43.0–77.0)
Platelets: 306 10*3/uL (ref 150.0–400.0)
RBC: 4.37 Mil/uL (ref 3.87–5.11)
RDW: 13.1 % (ref 11.5–15.5)
WBC: 5.7 10*3/uL (ref 4.0–10.5)

## 2023-02-05 LAB — TSH: TSH: 1.82 u[IU]/mL (ref 0.35–5.50)

## 2023-02-05 NOTE — Telephone Encounter (Signed)
-----   Message from Sheliah Hatch, MD sent at 02/05/2023  2:26 PM EDT ----- Labs look great!  No changes at this time!

## 2023-02-08 NOTE — Telephone Encounter (Signed)
Appears she did review results on MyChart, no further calls required

## 2023-02-08 NOTE — Telephone Encounter (Signed)
LM to call back.

## 2023-04-26 ENCOUNTER — Other Ambulatory Visit: Payer: Self-pay | Admitting: Family Medicine

## 2023-04-26 DIAGNOSIS — F32A Depression, unspecified: Secondary | ICD-10-CM

## 2023-06-02 ENCOUNTER — Other Ambulatory Visit: Payer: Self-pay | Admitting: Family Medicine

## 2023-06-06 ENCOUNTER — Other Ambulatory Visit: Payer: Self-pay | Admitting: Family Medicine

## 2023-06-06 DIAGNOSIS — F419 Anxiety disorder, unspecified: Secondary | ICD-10-CM

## 2023-08-23 ENCOUNTER — Encounter: Payer: Self-pay | Admitting: Family Medicine

## 2023-08-23 ENCOUNTER — Ambulatory Visit: Payer: BC Managed Care – PPO | Admitting: Family Medicine

## 2023-08-23 VITALS — BP 124/82 | HR 71 | Temp 97.9°F | Ht 67.5 in | Wt 178.0 lb

## 2023-08-23 DIAGNOSIS — Z23 Encounter for immunization: Secondary | ICD-10-CM

## 2023-08-23 DIAGNOSIS — E785 Hyperlipidemia, unspecified: Secondary | ICD-10-CM | POA: Diagnosis not present

## 2023-08-23 DIAGNOSIS — Z0000 Encounter for general adult medical examination without abnormal findings: Secondary | ICD-10-CM

## 2023-08-23 NOTE — Progress Notes (Signed)
   Subjective:    Patient ID: Dana Zavala, female    DOB: 1966/01/25, 57 y.o.   MRN: [id number]  HPI CPE- UTD on pap, mammo, colonoscopy, Tdap.  Will get flu today.  Patient Care Team    Relationship Specialty Notifications Start End  Sheliah Hatch, MD PCP - General Family Medicine  08/26/15   [email or web address]    08/23/23     Health Maintenance  Topic Date Due   INFLUENZA VACCINE  05/20/2023   COVID-19 Vaccine (3 - 2023-24 season) 06/20/2023   MAMMOGRAM  12/17/2023   Cervical Cancer Screening (HPV/Pap Cotest)  05/06/2025   Colonoscopy  05/21/2026   DTaP/Tdap/Td (3 - Td or Tdap) 04/12/2028   Hepatitis C Screening  Completed   HIV Screening  Completed   Zoster Vaccines- Shingrix  Completed   HPV VACCINES  Aged Out      Review of Systems Patient reports no vision/ hearing changes, adenopathy,fever,  persistant/recurrent hoarseness , swallowing issues, chest pain, palpitations, edema, persistant/recurrent cough, hemoptysis, dyspnea (rest/exertional/paroxysmal nocturnal), gastrointestinal bleeding (melena, rectal bleeding), abdominal pain, significant heartburn, bowel changes, GU symptoms (dysuria, hematuria, incontinence), Gyn symptoms (abnormal  bleeding, pain),  syncope, focal weakness, memory loss, numbness & tingling, skin/hair/nail changes, abnormal bruising or bleeding, anxiety, or depression.   + 18 lb weight loss- pt has dramatically cut carbs, eating smaller amounts    Objective:   Physical Exam General Appearance:    Alert, cooperative, no distress, appears stated age  Head:    Normocephalic, without obvious abnormality, atraumatic  Eyes:    PERRL, conjunctiva/corneas clear, EOM's intact both eyes  Ears:    Normal TM's and external ear canals, both ears  Nose:   Nares normal, septum midline, mucosa normal, no drainage    or sinus tenderness  Throat:   Lips, mucosa, and tongue normal; teeth and gums normal  Neck:    Supple, symmetrical, trachea midline, no adenopathy;    Thyroid: no enlargement/tenderness/nodules  Back:     Symmetric, no curvature, ROM normal, no CVA tenderness  Lungs:     Clear to auscultation bilaterally, respirations unlabored  Chest Wall:    No tenderness or deformity   Heart:    Regular rate and rhythm, S1 and S2 normal, no murmur, rub   or gallop  Breast Exam:    Deferred to GYN  Abdomen:     Soft, non-tender, bowel sounds active all four quadrants,    no masses, no organomegaly  Genitalia:    Deferred to GYN  Rectal:    Extremities:   Extremities normal, atraumatic, no cyanosis or edema  Pulses:   2+ and symmetric all extremities  Skin:   Skin color, texture, turgor normal, no rashes or lesions  Lymph nodes:   Cervical, supraclavicular, and axillary nodes normal  Neurologic:   CNII-XII intact, normal strength, sensation and reflexes    throughout          Assessment & Plan:

## 2023-08-23 NOTE — Patient Instructions (Signed)
Follow up in 6 months to recheck blood pressure and cholesterol We'll notify you of your lab results and make any changes if needed Continue to work on healthy diet and regular exercise- you look great! Call with any questions or concerns Stay Safe!  Stay Healthy! Happy Fall!!!

## 2023-08-23 NOTE — Assessment & Plan Note (Signed)
Pt's PE WNL.  UTD on pap, mammo, colonoscopy, Tdap.  Flu shot given.  Check labs.  Anticipatory guidance provided.

## 2023-08-24 LAB — CBC WITH DIFFERENTIAL/PLATELET
Basophils Absolute: 0.1 10*3/uL (ref 0.0–0.1)
Basophils Relative: 0.7 % (ref 0.0–3.0)
Eosinophils Absolute: 0.2 10*3/uL (ref 0.0–0.7)
Eosinophils Relative: 1.9 % (ref 0.0–5.0)
HCT: 43.4 % (ref 36.0–46.0)
Hemoglobin: 14.3 g/dL (ref 12.0–15.0)
Lymphocytes Relative: 28.3 % (ref 12.0–46.0)
Lymphs Abs: 2.4 10*3/uL (ref 0.7–4.0)
MCHC: 33 g/dL (ref 30.0–36.0)
MCV: 88.9 fL (ref 78.0–100.0)
Monocytes Absolute: 0.5 10*3/uL (ref 0.1–1.0)
Monocytes Relative: 5.8 % (ref 3.0–12.0)
Neutro Abs: 5.4 10*3/uL (ref 1.4–7.7)
Neutrophils Relative %: 63.3 % (ref 43.0–77.0)
Platelets: 311 10*3/uL (ref 150.0–400.0)
RBC: 4.88 Mil/uL (ref 3.87–5.11)
RDW: 13.4 % (ref 11.5–15.5)
WBC: 8.6 10*3/uL (ref 4.0–10.5)

## 2023-08-24 LAB — BASIC METABOLIC PANEL
BUN: 17 mg/dL (ref 6–23)
CO2: 30 meq/L (ref 19–32)
Calcium: 10.5 mg/dL (ref 8.4–10.5)
Chloride: 101 meq/L (ref 96–112)
Creatinine, Ser: 0.93 mg/dL (ref 0.40–1.20)
GFR: 68.28 mL/min (ref >60.00)
Glucose, Bld: 78 mg/dL (ref 70–99)
Potassium: 4.7 meq/L (ref 3.5–5.1)
Sodium: 139 meq/L (ref 135–145)

## 2023-08-24 LAB — LIPID PANEL
Cholesterol: 183 mg/dL (ref 0–200)
HDL: 51.2 mg/dL (ref >39.00)
LDL Cholesterol: 94 mg/dL (ref 0–99)
NonHDL: 131.4
Total CHOL/HDL Ratio: 4
Triglycerides: 188 mg/dL — ABNORMAL HIGH (ref 0.0–149.0)
VLDL: 37.6 mg/dL (ref 0.0–40.0)

## 2023-08-24 LAB — HEPATIC FUNCTION PANEL
ALT: 25 U/L (ref 0–35)
AST: 26 U/L (ref 0–37)
Albumin: 4.7 g/dL (ref 3.5–5.2)
Alkaline Phosphatase: 79 U/L (ref 39–117)
Bilirubin, Direct: 0.1 mg/dL (ref 0.0–0.3)
Total Bilirubin: 0.9 mg/dL (ref 0.2–1.2)
Total Protein: 7.4 g/dL (ref 6.0–8.3)

## 2023-08-24 LAB — TSH: TSH: 2.09 u[IU]/mL (ref 0.35–5.50)

## 2023-08-25 ENCOUNTER — Telehealth: Payer: Self-pay

## 2023-08-25 NOTE — Telephone Encounter (Signed)
-----   Message from Neena Rhymes sent at 08/25/2023 12:31 PM EST ----- Labs look great!  No changes at this time

## 2023-08-26 NOTE — Telephone Encounter (Signed)
 Left vm-Letter out.

## 2023-08-26 NOTE — Telephone Encounter (Signed)
Left vm to call office or look via MyChart

## 2023-09-28 ENCOUNTER — Encounter: Payer: Self-pay | Admitting: Family Medicine

## 2023-11-27 ENCOUNTER — Other Ambulatory Visit: Payer: Self-pay | Admitting: Family Medicine

## 2023-11-27 DIAGNOSIS — F32A Depression, unspecified: Secondary | ICD-10-CM

## 2023-12-08 ENCOUNTER — Other Ambulatory Visit: Payer: Self-pay | Admitting: Family Medicine

## 2023-12-08 DIAGNOSIS — Z1231 Encounter for screening mammogram for malignant neoplasm of breast: Secondary | ICD-10-CM

## 2023-12-21 ENCOUNTER — Ambulatory Visit
Admission: RE | Admit: 2023-12-21 | Discharge: 2023-12-21 | Disposition: A | Discharge status: Home / Self Care | Payer: 59 | Source: Ambulatory Visit | Attending: Family Medicine | Admitting: Family Medicine

## 2023-12-21 DIAGNOSIS — Z1231 Encounter for screening mammogram for malignant neoplasm of breast: Secondary | ICD-10-CM

## 2024-01-09 ENCOUNTER — Other Ambulatory Visit: Payer: Self-pay | Admitting: Family Medicine

## 2024-02-21 ENCOUNTER — Telehealth: Payer: Self-pay

## 2024-02-21 ENCOUNTER — Ambulatory Visit: Payer: BC Managed Care – PPO | Admitting: Family Medicine

## 2024-02-21 NOTE — Telephone Encounter (Signed)
 This patient was supposed to come back in 6 months and was scheduled for her physical this November. Can we call her to get her scheduled this month to recheck her cholesterol per Dr. Paulla Bossier.

## 2024-02-22 NOTE — Telephone Encounter (Signed)
LVM informing pt of message below.

## 2024-02-26 ENCOUNTER — Other Ambulatory Visit: Payer: Self-pay | Admitting: Family Medicine

## 2024-02-27 ENCOUNTER — Other Ambulatory Visit: Payer: Self-pay | Admitting: Family Medicine

## 2024-02-27 DIAGNOSIS — F419 Anxiety disorder, unspecified: Secondary | ICD-10-CM

## 2024-03-08 ENCOUNTER — Other Ambulatory Visit: Payer: Self-pay | Admitting: Family Medicine

## 2024-03-08 DIAGNOSIS — F32A Depression, unspecified: Secondary | ICD-10-CM

## 2024-03-16 ENCOUNTER — Other Ambulatory Visit: Payer: Self-pay | Admitting: Family Medicine

## 2024-03-16 MED ORDER — BUPROPION HCL 75 MG PO TABS: 75.0000 mg | ORAL_TABLET | Freq: Two times a day (BID) | ORAL | 0 refills | Status: DC

## 2024-03-16 MED ORDER — BUPROPION HCL ER (XL) 150 MG PO TB24: 150.0000 mg | ORAL_TABLET | Freq: Every day | ORAL | 1 refills | Status: AC

## 2024-03-16 NOTE — Addendum Note (Signed)
 Addended by: Diondre Pulis K on: 03/16/2024 10:12 AM   Modules accepted: Orders

## 2024-05-31 ENCOUNTER — Other Ambulatory Visit: Payer: Self-pay | Admitting: Family Medicine

## 2024-05-31 DIAGNOSIS — F419 Anxiety disorder, unspecified: Secondary | ICD-10-CM

## 2024-05-31 DIAGNOSIS — F32A Depression, unspecified: Secondary | ICD-10-CM

## 2024-08-24 ENCOUNTER — Encounter: Payer: BC Managed Care – PPO | Admitting: Family Medicine

## 2024-08-27 ENCOUNTER — Other Ambulatory Visit: Payer: Self-pay | Admitting: Family Medicine

## 2024-08-27 DIAGNOSIS — F419 Anxiety disorder, unspecified: Secondary | ICD-10-CM

## 2024-08-31 ENCOUNTER — Encounter: Payer: Self-pay | Admitting: Family Medicine

## 2024-08-31 ENCOUNTER — Ambulatory Visit: Payer: 59 | Admitting: Family Medicine

## 2024-08-31 VITALS — BP 142/76 | HR 74 | Temp 98.0°F | Resp 18 | Ht 67.5 in | Wt 207.4 lb

## 2024-08-31 DIAGNOSIS — Z0000 Encounter for general adult medical examination without abnormal findings: Secondary | ICD-10-CM

## 2024-08-31 DIAGNOSIS — Z23 Encounter for immunization: Secondary | ICD-10-CM | POA: Diagnosis not present

## 2024-08-31 DIAGNOSIS — E669 Obesity, unspecified: Secondary | ICD-10-CM | POA: Diagnosis not present

## 2024-08-31 NOTE — Patient Instructions (Addendum)
Follow up in 6 months to recheck blood pressure and cholesterol We'll notify you of your lab results and make any changes if needed Continue to work on healthy diet and regular exercise- you can do it! Call with any questions or concerns Stay Safe!  Stay Healthy! Happy Holidays!!

## 2024-08-31 NOTE — Progress Notes (Signed)
   Subjective:    Patient ID: Dana Zavala, female    DOB: [birth year], 58 y.o.   MRN: [id number]  HPI CPE- UTD on mammo, pap, colonoscopy, Tdap.  Due for flu  Health Maintenance  Topic Date Due   Hepatitis B Vaccines 19-59 Average Risk (1 of 3 - 19+ 3-dose series) Never done   Pneumococcal Vaccine: 50+ Years (1 of 1 - PCV) Never done   Influenza Vaccine  05/19/2024   COVID-19 Vaccine (3 - 2025-26 season) 06/19/2024   Mammogram  12/20/2024   Cervical Cancer Screening (HPV/Pap Cotest)  05/06/2025   Colonoscopy  05/21/2026   DTaP/Tdap/Td (3 - Td or Tdap) 04/12/2028   Hepatitis C Screening  Completed   HIV Screening  Completed   Zoster Vaccines- Shingrix   Completed   HPV VACCINES  Aged Out   Meningococcal B Vaccine  Aged Out     Patient Care Team    Relationship Specialty Notifications Start End  Mahlon Comer BRAVO, MD PCP - General Family Medicine  08/26/15   [email or web address]    08/23/23       Review of Systems Patient reports no vision/ hearing changes, adenopathy,fever, persistant/recurrent hoarseness , swallowing issues, chest pain, palpitations, edema, persistant/recurrent cough, hemoptysis, dyspnea (rest/exertional/paroxysmal nocturnal), gastrointestinal bleeding (melena, rectal bleeding), abdominal pain, significant heartburn, bowel changes, GU symptoms (dysuria, hematuria, incontinence), Gyn symptoms (abnormal  bleeding, pain),  syncope, focal weakness, memory loss, numbness & tingling, skin/hair/nail changes, abnormal bruising or bleeding, anxiety, or depression.   + 30 lb weight gain    Objective:   Physical Exam General Appearance:    Alert, cooperative, no distress, appears stated age, obese  Head:    Normocephalic, without obvious abnormality, atraumatic  Eyes:    PERRL, conjunctiva/corneas clear, EOM's intact both eyes  Ears:    Normal TM's and external ear canals, both ears  Nose:   Nares normal, septum midline,  mucosa normal, no drainage    or sinus tenderness  Throat:   Lips, mucosa, and tongue normal; teeth and gums normal  Neck:   Supple, symmetrical, trachea midline, no adenopathy;    Thyroid : no enlargement/tenderness/nodules  Back:     Symmetric, no curvature, ROM normal, no CVA tenderness  Lungs:     Clear to auscultation bilaterally, respirations unlabored  Chest Wall:    No tenderness or deformity   Heart:    Regular rate and rhythm, S1 and S2 normal, no murmur, rub   or gallop  Breast Exam:    Deferred to GYN  Abdomen:     Soft, non-tender, bowel sounds active all four quadrants,    no masses, no organomegaly  Genitalia:    Deferred to GYN  Rectal:    Extremities:   Extremities normal, atraumatic, no cyanosis or edema  Pulses:   2+ and symmetric all extremities  Skin:   Skin color, texture, turgor normal, no rashes or lesions  Lymph nodes:   Cervical, supraclavicular, and axillary nodes normal  Neurologic:   CNII-XII intact, normal strength, sensation and reflexes    throughout          Assessment & Plan:

## 2024-09-01 LAB — HEPATIC FUNCTION PANEL
ALT: 28 U/L (ref 0–35)
AST: 25 U/L (ref 0–37)
Albumin: 4.8 g/dL (ref 3.5–5.2)
Alkaline Phosphatase: 88 U/L (ref 39–117)
Bilirubin, Direct: 0.1 mg/dL (ref 0.0–0.3)
Total Bilirubin: 1.1 mg/dL (ref 0.2–1.2)
Total Protein: 7.1 g/dL (ref 6.0–8.3)

## 2024-09-01 LAB — CBC WITH DIFFERENTIAL/PLATELET
Basophils Absolute: 0.1 K/uL (ref 0.0–0.1)
Basophils Relative: 0.8 % (ref 0.0–3.0)
Eosinophils Absolute: 0.2 K/uL (ref 0.0–0.7)
Eosinophils Relative: 2.6 % (ref 0.0–5.0)
HCT: 41.1 % (ref 36.0–46.0)
Hemoglobin: 14 g/dL (ref 12.0–15.0)
Lymphocytes Relative: 26.2 % (ref 12.0–46.0)
Lymphs Abs: 2.3 K/uL (ref 0.7–4.0)
MCHC: 34.1 g/dL (ref 30.0–36.0)
MCV: 88.5 fl (ref 78.0–100.0)
Monocytes Absolute: 0.6 K/uL (ref 0.1–1.0)
Monocytes Relative: 6.8 % (ref 3.0–12.0)
Neutro Abs: 5.7 K/uL (ref 1.4–7.7)
Neutrophils Relative %: 63.6 % (ref 43.0–77.0)
Platelets: 339 K/uL (ref 150.0–400.0)
RBC: 4.64 Mil/uL (ref 3.87–5.11)
RDW: 12.8 % (ref 11.5–15.5)
WBC: 9 K/uL (ref 4.0–10.5)

## 2024-09-01 LAB — TSH: TSH: 1.71 u[IU]/mL (ref 0.35–5.50)

## 2024-09-01 LAB — LIPID PANEL
Cholesterol: 164 mg/dL (ref 0–200)
HDL: 45.7 mg/dL (ref >39.00)
LDL Cholesterol: 85 mg/dL (ref 0–99)
NonHDL: 118.23
Total CHOL/HDL Ratio: 4
Triglycerides: 167 mg/dL — ABNORMAL HIGH (ref 0.0–149.0)
VLDL: 33.4 mg/dL (ref 0.0–40.0)

## 2024-09-01 LAB — BASIC METABOLIC PANEL WITH GFR
BUN: 15 mg/dL (ref 6–23)
CO2: 29 meq/L (ref 19–32)
Calcium: 10.4 mg/dL (ref 8.4–10.5)
Chloride: 102 meq/L (ref 96–112)
Creatinine, Ser: 0.91 mg/dL (ref 0.40–1.20)
GFR: 69.58 mL/min (ref >60.00)
Glucose, Bld: 92 mg/dL (ref 70–99)
Potassium: 4.1 meq/L (ref 3.5–5.1)
Sodium: 138 meq/L (ref 135–145)

## 2024-09-01 LAB — VITAMIN D 25 HYDROXY (VIT D DEFICIENCY, FRACTURES): VITD: 23.11 ng/mL — ABNORMAL LOW (ref 30.00–100.00)

## 2024-09-03 ENCOUNTER — Other Ambulatory Visit: Payer: Self-pay | Admitting: Family Medicine

## 2024-09-04 ENCOUNTER — Ambulatory Visit: Payer: Self-pay | Admitting: Family Medicine

## 2024-09-04 MED ORDER — VITAMIN D (ERGOCALCIFEROL) 1.25 MG (50000 UNIT) PO CAPS: 50000.0000 [IU] | ORAL_CAPSULE | ORAL | 0 refills | Status: AC

## 2024-09-05 NOTE — Progress Notes (Signed)
 Called patient in regards to message below. Left vm to return call

## 2024-09-17 NOTE — Assessment & Plan Note (Signed)
 Pt's PE WNL w/ exception of BMI.  UTD on pap, mammo, colonoscopy, Tdap.  Flu shot given today.  Check labs.  Anticipatory guidance provided.

## 2024-09-21 ENCOUNTER — Other Ambulatory Visit: Payer: Self-pay | Admitting: Family Medicine

## 2024-09-21 DIAGNOSIS — F419 Anxiety disorder, unspecified: Secondary | ICD-10-CM

## 2024-09-21 NOTE — Telephone Encounter (Signed)
 Is it okay to switch patient to a 90 day supply? She has been getting 30 days

## 2024-11-15 ENCOUNTER — Other Ambulatory Visit: Payer: Self-pay | Admitting: Family Medicine

## 2025-02-28 ENCOUNTER — Ambulatory Visit: Admitting: Family Medicine

## 2025-09-03 ENCOUNTER — Encounter: Admitting: Family Medicine
